# Patient Record
Sex: Female | Born: 1945
Health system: Southern US, Community
[De-identification: ages and names within clinical notes are randomized; demographics above are authoritative.]

## PROBLEM LIST (undated history)

## (undated) DIAGNOSIS — D649 Anemia, unspecified: Secondary | ICD-10-CM

## (undated) DIAGNOSIS — N814 Uterovaginal prolapse, unspecified: Secondary | ICD-10-CM

## (undated) DIAGNOSIS — K589 Irritable bowel syndrome without diarrhea: Secondary | ICD-10-CM

## (undated) DIAGNOSIS — I10 Essential (primary) hypertension: Secondary | ICD-10-CM

## (undated) DIAGNOSIS — R7303 Prediabetes: Secondary | ICD-10-CM

## (undated) DIAGNOSIS — G500 Trigeminal neuralgia: Secondary | ICD-10-CM

## (undated) DIAGNOSIS — M1990 Unspecified osteoarthritis, unspecified site: Secondary | ICD-10-CM

## (undated) DIAGNOSIS — N1832 Chronic kidney disease, stage 3b: Secondary | ICD-10-CM

## (undated) DIAGNOSIS — K219 Gastro-esophageal reflux disease without esophagitis: Secondary | ICD-10-CM

## (undated) DIAGNOSIS — J189 Pneumonia, unspecified organism: Secondary | ICD-10-CM

## (undated) DIAGNOSIS — E785 Hyperlipidemia, unspecified: Secondary | ICD-10-CM

## (undated) DIAGNOSIS — F419 Anxiety disorder, unspecified: Secondary | ICD-10-CM

## (undated) HISTORY — DX: Irritable bowel syndrome, unspecified: K58.9

## (undated) HISTORY — PX: CHOLECYSTECTOMY, LAPAROSCOPIC: SHX56

## (undated) HISTORY — PX: TUBAL LIGATION: SHX77

## (undated) HISTORY — DX: Hyperlipidemia, unspecified: E78.5

## (undated) HISTORY — DX: Essential (primary) hypertension: I10

## (undated) HISTORY — DX: Chronic kidney disease, stage 3b: N18.32

## (undated) HISTORY — DX: Trigeminal neuralgia: G50.0

## (undated) HISTORY — DX: Uterovaginal prolapse, unspecified: N81.4

---

## 2009-05-20 ENCOUNTER — Encounter: Payer: Self-pay | Admitting: Sports Medicine

## 2009-06-15 ENCOUNTER — Ambulatory Visit: Payer: Self-pay | Admitting: Sports Medicine

## 2009-06-15 DIAGNOSIS — M79609 Pain in unspecified limb: Secondary | ICD-10-CM

## 2009-06-15 DIAGNOSIS — M722 Plantar fascial fibromatosis: Secondary | ICD-10-CM | POA: Insufficient documentation

## 2009-08-03 ENCOUNTER — Ambulatory Visit: Payer: Self-pay | Admitting: Sports Medicine

## 2010-08-02 NOTE — Assessment & Plan Note (Signed)
Summary: FU FOOT PAIN/MJD   Vital Signs:  Patient profile:   65 year old female BP sitting:   128 / 79  Vitals Entered By: Lillia Pauls CMA (August 03, 2009 1:37 PM)  History of Present Illness: 65 yo F here for f/u L plantar fasciitis.  Overall improving Compliant with stretches, icing at nighttime, wearing comforthotics, arch straps. H/o issues L plantar fascia > 2 years. Had 2 shots in plantar fascia By u/s last visit saw a partial tear of planar fascia in mid-substance Seemed to have compensatory anterior pain about ankle joint, medial to ant tibialis. No issues with right foot.  Medications Prior to Update: 1)  Hydrogesic 5-500 Mg Caps (Hydrocodone-Acetaminophen) .Marland Kitchen.. 1 By Mouth Two Times A Day To Qid Foot Pain 2)  Lisinopril 40 Mg Tabs (Lisinopril) .Marland Kitchen.. 1 By Mouth Qd 3)  Doxazosin Mesylate 8 Mg Tabs (Doxazosin Mesylate) .Marland Kitchen.. 1 By Mouth Qd 4)  Pepcid Ac 10 Mg Tabs (Famotidine) .Marland Kitchen.. 1 By Mouth Qhs  Social History: Reviewed history from 06/15/2009 and no changes required. 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  No other abnormalities. TTP medial anterior portion of calcaneus. Mild TTP midportion of plantar fascia. No TTP metatarsals.  No bony TTP about foot/ankle otherwise. Strength 5/5 all motions without reproducing pain. Pes planus more significant on L than R.  Adequate post tib function bilaterally.  RT foot is non tender   Impression & Recommendations:  Problem # 1:  PLANTAR FASCIITIS, TRAUMATIC (ICD-728.71) Assessment Improved Continue with icing, eccentric exercises, arch strap, sports insoles.  Added additional arch support (scaphoid pads) to comforthotics.  F/u in 6 weeks for recheck.  Finances an issue with regard to custom  orthotics but consider these in future if does not continue to improve.  Believe anterior ankle pain is related to walking differently with pain/gait issues.  Problem # 2:  FOOT PAIN, LEFT (ICD-729.5)  Complete Medication List: 1)  Hydrogesic 5-500 Mg Caps (Hydrocodone-acetaminophen) .Marland Kitchen.. 1 by mouth two times a day to qid foot pain 2)  Lisinopril 40 Mg Tabs (Lisinopril) .Marland Kitchen.. 1 by mouth qd 3)  Doxazosin Mesylate 8 Mg Tabs (Doxazosin mesylate) .Marland Kitchen.. 1 by mouth qd 4)  Pepcid Ac 10 Mg Tabs (Famotidine) .Marland Kitchen.. 1 by mouth qhs 5)  Tramadol Hcl 50 Mg Tabs (Tramadol hcl) .Marland Kitchen.. 1 tab by mouth three times a day as needed pain  Patient Instructions: 1)  Continue doing the eccentric exercises daily - stretch down on a step 3 sets of 15 with knee bent and 3 sets of 15 with knee straight every day.  Use a book if it's too difficult on a step. 2)  Use the arch strap for pain relief. 3)  I have added additional arch support to your green insoles. 4)  Ice at nighttime for 10 minutes in a bucket of ice water. 5)  Follow up with [place] in 6 weeks for reevaluation. 6)  Custom orthotics are a consideration if you are doing the above and not improving. Prescriptions: TRAMADOL HCL 50 MG TABS (TRAMADOL HCL) 1 tab by mouth three times a day as needed pain  #90 x 1   Entered and Authorized by:   Norton Blizzard MD   Signed by:   Norton Blizzard MD on 08/03/2009   Method used:  Electronically to        [facility]       [address]       [place], [place]  [address]       Ph: [phone] or [phone]       Fax: [phone]   RxID:   [id number]

## 2015-12-06 DIAGNOSIS — E669 Obesity, unspecified: Secondary | ICD-10-CM | POA: Diagnosis not present

## 2015-12-06 DIAGNOSIS — Z139 Encounter for screening, unspecified: Secondary | ICD-10-CM | POA: Diagnosis not present

## 2015-12-06 DIAGNOSIS — M722 Plantar fascial fibromatosis: Secondary | ICD-10-CM | POA: Diagnosis not present

## 2015-12-06 DIAGNOSIS — M25572 Pain in left ankle and joints of left foot: Secondary | ICD-10-CM | POA: Diagnosis not present

## 2015-12-06 DIAGNOSIS — Z9181 History of falling: Secondary | ICD-10-CM | POA: Diagnosis not present

## 2015-12-06 DIAGNOSIS — E782 Mixed hyperlipidemia: Secondary | ICD-10-CM | POA: Diagnosis not present

## 2015-12-06 DIAGNOSIS — G500 Trigeminal neuralgia: Secondary | ICD-10-CM | POA: Diagnosis not present

## 2015-12-06 DIAGNOSIS — I10 Essential (primary) hypertension: Secondary | ICD-10-CM | POA: Diagnosis not present

## 2015-12-06 DIAGNOSIS — G4700 Insomnia, unspecified: Secondary | ICD-10-CM | POA: Diagnosis not present

## 2015-12-06 DIAGNOSIS — Z79899 Other long term (current) drug therapy: Secondary | ICD-10-CM | POA: Diagnosis not present

## 2015-12-06 DIAGNOSIS — R7303 Prediabetes: Secondary | ICD-10-CM | POA: Diagnosis not present

## 2015-12-06 DIAGNOSIS — L600 Ingrowing nail: Secondary | ICD-10-CM | POA: Diagnosis not present

## 2016-03-13 DIAGNOSIS — Z79899 Other long term (current) drug therapy: Secondary | ICD-10-CM | POA: Diagnosis not present

## 2016-03-13 DIAGNOSIS — N951 Menopausal and female climacteric states: Secondary | ICD-10-CM | POA: Diagnosis not present

## 2016-03-13 DIAGNOSIS — N8111 Cystocele, midline: Secondary | ICD-10-CM | POA: Diagnosis not present

## 2016-03-13 DIAGNOSIS — N841 Polyp of cervix uteri: Secondary | ICD-10-CM | POA: Diagnosis not present

## 2016-03-13 DIAGNOSIS — Z6835 Body mass index (BMI) 35.0-35.9, adult: Secondary | ICD-10-CM | POA: Diagnosis not present

## 2016-03-13 DIAGNOSIS — M722 Plantar fascial fibromatosis: Secondary | ICD-10-CM | POA: Diagnosis not present

## 2016-06-05 DIAGNOSIS — Z5329 Procedure and treatment not carried out because of patient's decision for other reasons: Secondary | ICD-10-CM | POA: Diagnosis not present

## 2016-06-05 DIAGNOSIS — Z1389 Encounter for screening for other disorder: Secondary | ICD-10-CM | POA: Diagnosis not present

## 2016-06-05 DIAGNOSIS — I10 Essential (primary) hypertension: Secondary | ICD-10-CM | POA: Diagnosis not present

## 2016-06-05 DIAGNOSIS — G4700 Insomnia, unspecified: Secondary | ICD-10-CM | POA: Diagnosis not present

## 2016-06-05 DIAGNOSIS — Z2821 Immunization not carried out because of patient refusal: Secondary | ICD-10-CM | POA: Diagnosis not present

## 2016-06-05 DIAGNOSIS — Z6834 Body mass index (BMI) 34.0-34.9, adult: Secondary | ICD-10-CM | POA: Diagnosis not present

## 2016-06-05 DIAGNOSIS — G500 Trigeminal neuralgia: Secondary | ICD-10-CM | POA: Diagnosis not present

## 2016-06-05 DIAGNOSIS — M722 Plantar fascial fibromatosis: Secondary | ICD-10-CM | POA: Diagnosis not present

## 2016-06-05 DIAGNOSIS — E782 Mixed hyperlipidemia: Secondary | ICD-10-CM | POA: Diagnosis not present

## 2016-06-05 DIAGNOSIS — E669 Obesity, unspecified: Secondary | ICD-10-CM | POA: Diagnosis not present

## 2016-06-05 DIAGNOSIS — R7303 Prediabetes: Secondary | ICD-10-CM | POA: Diagnosis not present

## 2016-07-02 DIAGNOSIS — J0100 Acute maxillary sinusitis, unspecified: Secondary | ICD-10-CM | POA: Diagnosis not present

## 2016-07-02 DIAGNOSIS — J309 Allergic rhinitis, unspecified: Secondary | ICD-10-CM | POA: Diagnosis not present

## 2016-08-01 DIAGNOSIS — E669 Obesity, unspecified: Secondary | ICD-10-CM | POA: Diagnosis not present

## 2016-08-01 DIAGNOSIS — I10 Essential (primary) hypertension: Secondary | ICD-10-CM | POA: Insufficient documentation

## 2016-08-01 DIAGNOSIS — G500 Trigeminal neuralgia: Secondary | ICD-10-CM | POA: Diagnosis not present

## 2016-08-01 DIAGNOSIS — Z6838 Body mass index (BMI) 38.0-38.9, adult: Secondary | ICD-10-CM | POA: Insufficient documentation

## 2016-09-04 DIAGNOSIS — G4700 Insomnia, unspecified: Secondary | ICD-10-CM | POA: Diagnosis not present

## 2016-09-04 DIAGNOSIS — Z79891 Long term (current) use of opiate analgesic: Secondary | ICD-10-CM | POA: Diagnosis not present

## 2016-09-04 DIAGNOSIS — E782 Mixed hyperlipidemia: Secondary | ICD-10-CM | POA: Diagnosis not present

## 2016-09-04 DIAGNOSIS — M722 Plantar fascial fibromatosis: Secondary | ICD-10-CM | POA: Diagnosis not present

## 2016-09-04 DIAGNOSIS — R7303 Prediabetes: Secondary | ICD-10-CM | POA: Diagnosis not present

## 2016-09-04 DIAGNOSIS — Z5329 Procedure and treatment not carried out because of patient's decision for other reasons: Secondary | ICD-10-CM | POA: Diagnosis not present

## 2016-09-04 DIAGNOSIS — Z6838 Body mass index (BMI) 38.0-38.9, adult: Secondary | ICD-10-CM | POA: Diagnosis not present

## 2016-11-29 ENCOUNTER — Ambulatory Visit (INDEPENDENT_AMBULATORY_CARE_PROVIDER_SITE_OTHER): Payer: PPO | Admitting: Podiatry

## 2016-11-29 ENCOUNTER — Encounter: Payer: Self-pay | Admitting: Podiatry

## 2016-11-29 ENCOUNTER — Ambulatory Visit (INDEPENDENT_AMBULATORY_CARE_PROVIDER_SITE_OTHER): Payer: PPO

## 2016-11-29 DIAGNOSIS — M129 Arthropathy, unspecified: Secondary | ICD-10-CM

## 2016-11-29 DIAGNOSIS — R52 Pain, unspecified: Secondary | ICD-10-CM

## 2016-11-29 DIAGNOSIS — M722 Plantar fascial fibromatosis: Secondary | ICD-10-CM | POA: Diagnosis not present

## 2016-11-29 MED ORDER — MELOXICAM 15 MG PO TABS: 15.0000 mg | ORAL_TABLET | Freq: Every day | ORAL | 0 refills | Status: DC

## 2016-11-29 NOTE — Progress Notes (Addendum)
   Subjective:    Patient ID: Abigail Glover, female    DOB: [birth year], 71 y.o.   MRN: [id number]  HPI this patient presents the office with chief complaint of 2 painful feet she says her left foot is painful on the top of her foot she says it is painful after activity she says she has pain noted in the right heel which is present upon rising and standing from a sitting position she says her pain level is approximately 6-10 she says she has provided no self treatment or sought  any professional help.  She presents the office today for evaluation and treatment of these conditions.    Review of Systems  Musculoskeletal: Positive for gait problem.  All other systems reviewed and are negative.      Objective:   Physical Exam GENERAL APPEARANCE: Alert, conversant. Appropriately groomed. No acute distress.  VASCULAR: Pedal pulses are  palpable at  [facility] and PT bilateral.  Capillary refill time is immediate to all digits,  Normal temperature gradient.   NEUROLOGIC: sensation is normal to 5.07 monofilament at 5/5 sites bilateral.  Light touch is intact bilateral, Muscle strength normal.  MUSCULOSKELETAL: acceptable muscle strength, tone and stability bilateral.  Intrinsic muscluature intact bilateral.  Rectus appearance of foot and digits noted bilateral.  Palpable pain at the insertion plantar fascia right foot.  Pain to Liz-Frank Joint left foot.  DERMATOLOGIC: skin color, texture, and turgor are within normal limits.  No preulcerative lesions or ulcers  are seen, no interdigital maceration noted.  No open lesions present.  Digital nails are asymptomatic. No drainage noted.         Assessment & Plan:  Plantar fascitis  B/L  Arthritis left foot.    IE  X-rays were taken which revealed calcification at the insertion of the plantar fascia both feet and I recommended she wear power step insoles in an effort to relieve the pole of the plantar fascia on the right foot and relieved stretch to the  Lisfranc's joint of the left foot we also called in Mobic  for her to take one a day. she is returns the office in 3 weeks which time we will reevaluate her condition.  Injection therapy right heel.   Gardiner Barefoot DPM

## 2016-12-05 DIAGNOSIS — M722 Plantar fascial fibromatosis: Secondary | ICD-10-CM | POA: Diagnosis not present

## 2016-12-05 DIAGNOSIS — Z6839 Body mass index (BMI) 39.0-39.9, adult: Secondary | ICD-10-CM | POA: Diagnosis not present

## 2016-12-05 DIAGNOSIS — I10 Essential (primary) hypertension: Secondary | ICD-10-CM | POA: Diagnosis not present

## 2016-12-05 DIAGNOSIS — Z5181 Encounter for therapeutic drug level monitoring: Secondary | ICD-10-CM | POA: Diagnosis not present

## 2016-12-05 DIAGNOSIS — E782 Mixed hyperlipidemia: Secondary | ICD-10-CM | POA: Diagnosis not present

## 2016-12-05 DIAGNOSIS — G4700 Insomnia, unspecified: Secondary | ICD-10-CM | POA: Diagnosis not present

## 2016-12-05 DIAGNOSIS — R7303 Prediabetes: Secondary | ICD-10-CM | POA: Diagnosis not present

## 2016-12-20 ENCOUNTER — Ambulatory Visit (INDEPENDENT_AMBULATORY_CARE_PROVIDER_SITE_OTHER): Payer: PPO | Admitting: Podiatry

## 2016-12-20 ENCOUNTER — Encounter: Payer: Self-pay | Admitting: Podiatry

## 2016-12-20 VITALS — BP 153/85 | HR 82

## 2016-12-20 DIAGNOSIS — M722 Plantar fascial fibromatosis: Secondary | ICD-10-CM | POA: Diagnosis not present

## 2016-12-20 MED ORDER — DICLOFENAC SODIUM 75 MG PO TBEC: 75.0000 mg | DELAYED_RELEASE_TABLET | Freq: Two times a day (BID) | ORAL | 1 refills | Status: DC

## 2016-12-20 NOTE — Progress Notes (Signed)
   Subjective:    Patient ID: Abigail Glover, female    DOB: August 13, 1945, 71 y.o.   MRN: [id number]  HPI this patient presents the office For continued evaluation of her plantar fasciitis. She was treated with injection therapy in her right heel at her last visit and given a prescription of Mobic and power step insoles. She says her right heel is approximately 50% better and that the pain continues. She also says pain continues in her left heel. She says she does not feel the medication by mouth has been effective. She does say that she was very active this past weekend shopping which she says has led to the pain returning. She presents the office today for an evaluation and treatment of her plantar fasciitis.  Patient does not mention her arthritis pain at this visit.    Review of Systems  Musculoskeletal: Positive for gait problem.  All other systems reviewed and are negative.      Objective:   Physical Exam GENERAL APPEARANCE: Alert, conversant. Appropriately groomed. No acute distress.  VASCULAR: Pedal pulses are  palpable at  [facility] and PT bilateral.  Capillary refill time is immediate to all digits,  Normal temperature gradient.   NEUROLOGIC: sensation is normal to 5.07 monofilament at 5/5 sites bilateral.  Light touch is intact bilateral, Muscle strength normal.  MUSCULOSKELETAL: acceptable muscle strength, tone and stability bilateral.  Intrinsic muscluature intact bilateral.  Rectus appearance of foot and digits noted bilateral.  Palpable pain at the insertion plantar fascia right foot and left foot.  DERMATOLOGIC: skin color, texture, and turgor are within normal limits.  No preulcerative lesions or ulcers  are seen, no interdigital maceration noted.  No open lesions present.  Digital nails are asymptomatic. No drainage noted.         Assessment & Plan:  Plantar fascitis  B/L  Arthritis left foot.    ROV  . After discussing the previous treatment with this patient. We decided to  give her more time to allow the foot to heal itself.  She will be prescribed Voltaren  75 mg instead of the Mobic and to continue to wear the power step insoles. She is to return the office in 3 weeks at which time we will reevaluate her condition  and treat her.     Gardiner Barefoot DPM   Gardiner Barefoot DPM

## 2016-12-25 ENCOUNTER — Encounter: Payer: Self-pay | Admitting: Neurology

## 2016-12-26 ENCOUNTER — Other Ambulatory Visit: Payer: Self-pay | Admitting: *Deleted

## 2016-12-26 MED ORDER — DICLOFENAC SODIUM 75 MG PO TBEC: 75.0000 mg | DELAYED_RELEASE_TABLET | Freq: Two times a day (BID) | ORAL | 1 refills | Status: DC

## 2017-01-16 DIAGNOSIS — J0100 Acute maxillary sinusitis, unspecified: Secondary | ICD-10-CM | POA: Diagnosis not present

## 2017-01-23 ENCOUNTER — Ambulatory Visit (INDEPENDENT_AMBULATORY_CARE_PROVIDER_SITE_OTHER): Payer: PPO | Admitting: Podiatry

## 2017-01-23 DIAGNOSIS — M722 Plantar fascial fibromatosis: Secondary | ICD-10-CM

## 2017-01-23 MED ORDER — MELOXICAM 15 MG PO TABS: 15.0000 mg | ORAL_TABLET | Freq: Every day | ORAL | 0 refills | Status: DC

## 2017-01-23 NOTE — Progress Notes (Signed)
   Subjective:    Patient ID: Abigail Glover, female    DOB: Mar 07, 1946, 71 y.o.   MRN: [id number]  Foot Pain    This patient presents the office for continued evaluation of plantar fasciitis right heel.  She was given a prescription for Voltaren and told to continue to wear her power step insoles.  She says there has been minimal improvement and that she returns the office prepared for the injection into the right heel.  She presents the office today for an evaluation and treatment of her plantar fascial problem    Review of Systems  Musculoskeletal: Positive for gait problem.  All other systems reviewed and are negative.      Objective:   Physical Exam GENERAL APPEARANCE: Alert, conversant. Appropriately groomed. No acute distress.  VASCULAR: Pedal pulses are  palpable at  [facility] and PT bilateral.  Capillary refill time is immediate to all digits,  Normal temperature gradient.   NEUROLOGIC: sensation is normal to 5.07 monofilament at 5/5 sites bilateral.  Light touch is intact bilateral, Muscle strength normal.  MUSCULOSKELETAL: acceptable muscle strength, tone and stability bilateral.  Intrinsic muscluature intact bilateral.  Rectus appearance of foot and digits noted bilateral.  Palpable pain at the insertion plantar fascia right foot and left foot.  DERMATOLOGIC: skin color, texture, and turgor are within normal limits.  No preulcerative lesions or ulcers  are seen, no interdigital maceration noted.  No open lesions present.  Digital nails are asymptomatic. No drainage noted.         Assessment & Plan:  Plantar fascitis  B/L  Arthritis left foot.    ROV   Injection therapy right heel.  Patient told to continue using her power step insoles.  Prescribe Mobic by mouth.  Return to the clinic in 3 weeks   Gardiner Barefoot DPM   Gardiner Barefoot DPM

## 2017-02-15 ENCOUNTER — Ambulatory Visit (INDEPENDENT_AMBULATORY_CARE_PROVIDER_SITE_OTHER): Payer: PPO | Admitting: Podiatry

## 2017-02-15 ENCOUNTER — Encounter: Payer: Self-pay | Admitting: Podiatry

## 2017-02-15 DIAGNOSIS — M722 Plantar fascial fibromatosis: Secondary | ICD-10-CM

## 2017-02-15 NOTE — Progress Notes (Signed)
This patient returns to the office follow-up for plantar fasciitis bilaterally with pain more severe in her right heel.  She says that her pain has become less but she is still having pain and discomfort when she walks.  She says she still experiencing throbbing even in a sitting position.  She is been treated previously with power step insoles injection therapy and anti-inflammatory medication.  She says her pain is still at a level V.  She still says she has swelling noted in her right heel compared to her left heel.  She presents the office today for continued evaluation and treatment of her painful right heel   GENERAL APPEARANCE: Alert, conversant. Appropriately groomed. No acute distress.  VASCULAR: Pedal pulses are  palpable at  [facility] and PT bilateral.  Capillary refill time is immediate to all digits,  Normal temperature gradient.  Digital hair growth is present bilateral  NEUROLOGIC: sensation is normal to 5.07 monofilament at 5/5 sites bilateral.  Light touch is intact bilateral, Muscle strength normal.  MUSCULOSKELETAL: acceptable muscle strength, tone and stability bilateral.  Intrinsic muscluature intact bilateral.  Rectus appearance of foot and digits noted bilateral. Palpable pain at the insertion right plantar fascia with marked swelling noted.  DERMATOLOGIC: skin color, texture, and turgor are within normal limits.  No preulcerative lesions or ulcers  are seen, no interdigital maceration noted.  No open lesions present.  Digital nails are asymptomatic. No drainage noted.  Plantar fascitis right heel.  ROV  patient has continued pain noted in her right heel.  Dispensed a plantar fascial brace to be worn on her foot and she was told to remove the power step insoles.  She was told to continue taking her Mobic.  She is to wear  the plantar fascial brace for 2 weeks and take the medication.  RTC 2 weeks.  This patient will be evaluated in 2 weeks and if the pain persists we will try a night  brace  if there is no improvement, we will then consider surgical correction.   Gardiner Barefoot DPM.

## 2017-03-01 ENCOUNTER — Telehealth: Payer: Self-pay | Admitting: Podiatry

## 2017-03-01 MED ORDER — MELOXICAM 15 MG PO TABS: 15.0000 mg | ORAL_TABLET | Freq: Every day | ORAL | 0 refills | Status: DC

## 2017-03-01 NOTE — Telephone Encounter (Signed)
Pt needs refill of Meloxicam. Pharmacy, [facility].

## 2017-03-01 NOTE — Addendum Note (Signed)
Addended by: Harriett Sine D on: 03/01/2017 04:54 PM   Modules accepted: Orders

## 2017-03-07 ENCOUNTER — Ambulatory Visit (INDEPENDENT_AMBULATORY_CARE_PROVIDER_SITE_OTHER): Payer: PPO | Admitting: Podiatry

## 2017-03-07 ENCOUNTER — Encounter: Payer: Self-pay | Admitting: Podiatry

## 2017-03-07 DIAGNOSIS — M722 Plantar fascial fibromatosis: Secondary | ICD-10-CM

## 2017-03-07 NOTE — Progress Notes (Signed)
This patient returns to the office follow-up for plantar fasciitis bilaterally with pain more severe in her right heel.  She says that her pain has become less but she is still having pain and discomfort when she walks.  She has previously been seen in the office and treated with injection therapy power step insoles and low back.  Her last visit she was treated with a plantar fascial brace.  She says that her pain now is 3 out of 10.  She says the brace seems to be helping as well as a change of foot gear   She presents the office today for continued evaluation and treatment of her painful right heel   GENERAL APPEARANCE: Alert, conversant. Appropriately groomed. No acute distress.  VASCULAR: Pedal pulses are  palpable at  [facility] and PT bilateral.  Capillary refill time is immediate to all digits,  Normal temperature gradient.  Digital hair growth is present bilateral  NEUROLOGIC: sensation is normal to 5.07 monofilament at 5/5 sites bilateral.  Light touch is intact bilateral, Muscle strength normal.  MUSCULOSKELETAL: acceptable muscle strength, tone and stability bilateral.  Intrinsic muscluature intact bilateral.  Rectus appearance of foot and digits noted bilateral. Palpable pain at the insertion right plantar fascia with marked swelling noted.  DERMATOLOGIC: skin color, texture, and turgor are within normal limits.  No preulcerative lesions or ulcers  are seen, no interdigital maceration noted.  No open lesions present.  Digital nails are asymptomatic. No drainage noted.  Plantar fascitis right heel.  ROV  patient was told to continue taking Mobic by mouth.  She is to continue wearing her approved footgear.  Continue to use the plantar fascial brace.  I will prescribe her a night brace to be worn since her pain continues and has not completely resolved.  She is to return the office in 4 weeks for further evaluation and treatment   Gardiner Barefoot DPM.

## 2017-03-13 ENCOUNTER — Telehealth: Payer: Self-pay | Admitting: *Deleted

## 2017-03-13 MED ORDER — MELOXICAM 15 MG PO TABS: 15.0000 mg | ORAL_TABLET | Freq: Every day | ORAL | 0 refills | Status: DC

## 2017-03-13 NOTE — Telephone Encounter (Signed)
Pt states she saw Dr. Prudence Davidson on 03/07/2017 and would like to get refill of Meloxicam. Dr. Prudence Davidson ordered refill Meloxicam 15mg  #30 one daily and pt needs an appt prior to future refills.

## 2017-03-14 DIAGNOSIS — I10 Essential (primary) hypertension: Secondary | ICD-10-CM | POA: Diagnosis not present

## 2017-03-14 DIAGNOSIS — M722 Plantar fascial fibromatosis: Secondary | ICD-10-CM | POA: Diagnosis not present

## 2017-03-14 DIAGNOSIS — E782 Mixed hyperlipidemia: Secondary | ICD-10-CM | POA: Diagnosis not present

## 2017-03-14 DIAGNOSIS — E669 Obesity, unspecified: Secondary | ICD-10-CM | POA: Diagnosis not present

## 2017-03-14 DIAGNOSIS — Z6839 Body mass index (BMI) 39.0-39.9, adult: Secondary | ICD-10-CM | POA: Diagnosis not present

## 2017-03-14 DIAGNOSIS — Z9181 History of falling: Secondary | ICD-10-CM | POA: Diagnosis not present

## 2017-03-14 DIAGNOSIS — Z79899 Other long term (current) drug therapy: Secondary | ICD-10-CM | POA: Diagnosis not present

## 2017-03-14 DIAGNOSIS — G4700 Insomnia, unspecified: Secondary | ICD-10-CM | POA: Diagnosis not present

## 2017-03-30 ENCOUNTER — Encounter: Payer: Self-pay | Admitting: Neurology

## 2017-03-30 ENCOUNTER — Ambulatory Visit (INDEPENDENT_AMBULATORY_CARE_PROVIDER_SITE_OTHER): Payer: PPO | Admitting: Neurology

## 2017-03-30 VITALS — BP 120/78 | HR 105 | Wt 206.0 lb

## 2017-03-30 DIAGNOSIS — M79605 Pain in left leg: Secondary | ICD-10-CM

## 2017-03-30 DIAGNOSIS — G500 Trigeminal neuralgia: Secondary | ICD-10-CM

## 2017-03-30 MED ORDER — TIZANIDINE HCL 2 MG PO CAPS: 2.0000 mg | ORAL_CAPSULE | Freq: Every evening | ORAL | 0 refills | Status: DC | PRN

## 2017-03-30 NOTE — Progress Notes (Signed)
[facility] Clinic Note - Initial Visit   Date: 03/30/17  Abigail Glover MRN: [id number] DOB: 1945-11-04   Dear Dr. Tonia Ghent:  Thank you for your kind referral of Abigail Glover for consultation of left trigeminal neuralgia. Although her history is well known to you, please allow [place] to reiterate it for the purpose of our medical record. The patient was accompanied to the clinic by self.    History of Present Illness: Abigail Glover is a 71 y.o. left-handed Caucasian female with hypertension presenting for evaluation of left trigeminal neuralgia.    She was diagnosed with left trigeminal neuralgia in 2014 by Dr. Metta Clines, which was well-controlled on gabapentin so she had stopped the medication.  Symptoms always started in the left ear and would radiate into the jaw region.  She never had pain involving the left eye, forehead, and only rare into the cheek.  Symptoms were aggravated by cold drink and cold air.  She does not have teeth on the left side, so does not chew on that side or brush her teeth.  Her symptoms were doing great off medication for several years, but in early January 2018, she began having another flare of pain and saw Dr. Tonia Ghent.  She was restarted on gabapentin 1200mg  TID and due to progressive pain, she was placed on carbamazepine 200mg  3-4 times daily which helped control symptoms.  She continues to have mild prickly sharp pain over the left jaw, which constant. MRI brain has not been done.  She has no history of diabetes.  She also complains of left posterior thigh pain which started several weeks ago.  Pain is sharp and non-radiating below the left buttocks.  There is no associated weakness or numbness/tingling.  Pain is present at rest and feels achy, and worse with standing.      Past Medical History:  Diagnosis Date  . Hypertension   . Trigeminal neuralgia of left side of face     History reviewed. No pertinent surgical  history.   Medications:  Outpatient Encounter Prescriptions as of 03/30/2017  Medication Sig  . aspirin EC 81 MG tablet Take 81 mg by mouth daily.  . calcium carbonate (TUMS - DOSED IN MG ELEMENTAL CALCIUM) 500 MG chewable tablet Chew 1 tablet by mouth daily.  . carbamazepine (TEGRETOL) 200 MG tablet   . gabapentin (NEURONTIN) 600 MG tablet   . hydrALAZINE (APRESOLINE) 25 MG tablet   . HYDROcodone-acetaminophen (NORCO/VICODIN) 5-325 MG tablet   . losartan (COZAAR) 100 MG tablet   . meloxicam (MOBIC) 15 MG tablet Take 15 mg by mouth daily.  Marland Kitchen omeprazole (PRILOSEC) 10 MG capsule Take 10 mg by mouth.  . terazosin (HYTRIN) 10 MG capsule Take 10 mg by mouth.  Marland Kitchen UNABLE TO FIND Take by mouth. calcium carbonate (TUMS) 200 mg calcium (500 mg) chewable tablet  . zolpidem (AMBIEN) 10 MG tablet Take 10 mg by mouth at bedtime as needed for sleep.  . tizanidine (ZANAFLEX) 2 MG capsule Take 1 capsule (2 mg total) by mouth at bedtime as needed for muscle spasms.  . [DISCONTINUED] diclofenac (VOLTAREN) 75 MG EC tablet Take 1 tablet (75 mg total) by mouth 2 (two) times daily.  . [DISCONTINUED] doxazosin (CARDURA) 8 MG tablet Take 8 mg by mouth daily.    . [DISCONTINUED] famotidine (PEPCID) 10 MG tablet Take 10 mg by mouth at bedtime.    . [DISCONTINUED] hydrocodone-acetaminophen (LORCET-HD) 5-500 MG per capsule Take 1 capsule by mouth. 2-4 times  per day for foot pain.   . [DISCONTINUED] lisinopril (PRINIVIL,ZESTRIL) 40 MG tablet Take 40 mg by mouth daily.    . [DISCONTINUED] meloxicam (MOBIC) 15 MG tablet Take 1 tablet (15 mg total) by mouth daily.  . [DISCONTINUED] traMADol (ULTRAM) 50 MG tablet Take 50 mg by mouth 3 (three) times daily as needed.     No facility-administered encounter medications on file as of 03/30/2017.      Allergies:  Allergies  Allergen Reactions  . Amitriptyline   . Cephalexin   . Penicillin G   . Prednisone   . Seasonal Ic [Cholestatin]     Family History:  No family  history of neurological disease.   Social History: Social History  Substance Use Topics  . Smoking status: Former Research scientist (life sciences)  . Smokeless tobacco: Never Used  . Alcohol use No   Social History   Social History Narrative   Lives alone in a one story home.  Has 1 dog and 1 daughter.  Works as Scientist, water quality at [employer].  Education: high school.    Review of Systems:  CONSTITUTIONAL: No fevers, chills, night sweats, or weight loss.   EYES: No visual changes or eye pain ENT: No hearing changes.  No history of nose bleeds.   RESPIRATORY: No cough, wheezing and shortness of breath.   CARDIOVASCULAR: Negative for chest pain, and palpitations.   GI: Negative for abdominal discomfort, blood in stools or black stools.  No recent change in bowel habits.   GU:  No history of incontinence.   MUSCLOSKELETAL: No history of joint pain or swelling.  No myalgias.   SKIN: Negative for lesions, rash, and itching.   HEMATOLOGY/ONCOLOGY: Negative for prolonged bleeding, bruising easily, and swollen nodes.  No history of cancer.   ENDOCRINE: Negative for cold or heat intolerance, polydipsia or goiter.   PSYCH:  No depression or anxiety symptoms.   NEURO: As Above.   Vital Signs:  BP 120/78   Pulse (!) 105   Wt 206 lb (93.4 kg)   SpO2 97%   BMI 40.23 kg/m  Pain Scale: 0 on a scale of 0-10   General Medical Exam:   General:  Well appearing, comfortable.   Eyes/ENT: see cranial nerve examination.   Neck: No masses appreciated.  Full range of motion without tenderness.  No carotid bruits. Respiratory:  Clear to auscultation, good air entry bilaterally.   Cardiac:  Regular rate and rhythm, no murmur.   Extremities:  No deformities, edema, or skin discoloration.  Skin:  No rashes or lesions.  Neurological Exam: MENTAL STATUS including orientation to time, place, person, recent and remote memory, attention span and concentration, language, and fund of knowledge is normal.  Speech is not  dysarthric.  CRANIAL NERVES: II:  No visual field defects.  Unremarkable fundi.   III-IV-VI: Pupils equal round and reactive to light.  Normal conjugate, extra-ocular eye movements in all directions of gaze.  No nystagmus.  Mild left ptosis.   V:  Normal facial sensation.  Marland Kitchen   VII:  Normal facial symmetry and movements.   VIII:  Normal hearing and vestibular function.   IX-X:  Normal palatal movement.   XI:  Normal shoulder shrug and head rotation.   XII:  Normal tongue strength and range of motion, no deviation or fasciculation.  MOTOR:  No atrophy, fasciculations or abnormal movements.  No pronator drift.  Tone is normal.    Right Upper Extremity:    Left Upper Extremity:  Deltoid  5/5   Deltoid  5/5   Biceps  5/5   Biceps  5/5   Triceps  5/5   Triceps  5/5   Wrist extensors  5/5   Wrist extensors  5/5   Wrist flexors  5/5   Wrist flexors  5/5   Finger extensors  5/5   Finger extensors  5/5   Finger flexors  5/5   Finger flexors  5/5   Dorsal interossei  5/5   Dorsal interossei  5/5   Abductor pollicis  5/5   Abductor pollicis  5/5   Tone (Ashworth scale)  0  Tone (Ashworth scale)  0   Right Lower Extremity:    Left Lower Extremity:    Hip flexors  5/5   Hip flexors  5/5   Hip extensors  5/5   Hip extensors  5/5   Knee flexors  5/5   Knee flexors  5/5   Knee extensors  5/5   Knee extensors  5/5   Dorsiflexors  5/5   Dorsiflexors  5/5   Plantarflexors  5/5   Plantarflexors  5/5   Toe extensors  5/5   Toe extensors  5/5   Toe flexors  5/5   Toe flexors  5/5   Tone (Ashworth scale)  0  Tone (Ashworth scale)  0   MSRs:  Right                                                                 Left brachioradialis 2+  brachioradialis 2+  biceps 2+  biceps 2+  triceps 2+  triceps 2+  patellar 2+  patellar 2+  ankle jerk 2+  ankle jerk 2+  Hoffman no  Hoffman no  plantar response down  plantar response down   SENSORY:  Normal and symmetric perception of light touch, pinprick,  vibration, and proprioception.  Romberg's sign absent.   COORDINATION/GAIT: Normal finger-to- nose-finger and heel-to-shin.  Intact rapid alternating movements bilaterally.  Gait is antalgic due to left leg pain.   IMPRESSION: 1.  Left trigeminal neuralgia, diagnosed in 2014.  Symptoms initially controlled on gabapentin but earlier in 2018, she started having severe pain therefore carbmazapine 200mg  3-4 times daily was started, which has helped her pain and she is tolerating.  Currently, she denies severe lancinating pain and would like to reduce the amount of medication that she is taking.  I have given her a slow taper schedule of reducing gabapentin by 300mg /week.  Stay on carbamazepine 200mg  3-4 times daily.  MRI brain declined at this time due to financial constraints, but she will reconsider imaging if symptoms get worse.  I have asked her to call the office with an update in 2 months notifying us of the dose of gabapentin and if pain is still controlled.  If so, may consider tapering further by 300mg /week.  2.  Left leg pain, likely musculoskeletal.  No features to suggest this is radicular pain.  Recommend ibuprofen and offered tizanidine 2mg  at bedtime.  Follow-up with PCP, if no improvement  Return to clinic in 4 months   The duration of this appointment visit was 45 minutes of face-to-face time with the patient.  Greater than 50% of this time was spent in counseling, explanation of  diagnosis, planning of further management, and coordination of care.   Thank you for allowing me to participate in patient's care.  If I can answer any additional questions, I would be pleased to do so.    Sincerely,    Donika K. Posey Pronto, DO

## 2017-03-30 NOTE — Patient Instructions (Addendum)
1.  Reduce gabapentin as follows  Gabapentin 600 mg tablets    Morning       Afternoon        Evening  Week 1 1.5 tab  2 tab             2 tab              Week 2 1.5 tab            1.5 tab           2 tab              Week 3 1.5 tab          1.5 tab          1.5 tab         Week 4 1 tab  1.5  tab         1.5 tab              Week 5 1 tab                1 tab             1.5 tab              Continue  1 tab          1 tab             1 tab         Call with update in 2 months, to determine further increase in medication.  If you develop increased pain, go back to taking the higher dose that controlled symptoms.            2.  Continue carbamazepine 200mg  three times daily  3.  Start tizandine 2mg  at bedtime for your left leg pain.   You can also take ibuprofen 200mg  three times daily as needed for pain  Return to clinic in 4 months

## 2017-04-01 DIAGNOSIS — M25552 Pain in left hip: Secondary | ICD-10-CM | POA: Diagnosis not present

## 2017-04-01 DIAGNOSIS — S76012A Strain of muscle, fascia and tendon of left hip, initial encounter: Secondary | ICD-10-CM | POA: Diagnosis not present

## 2017-04-03 DIAGNOSIS — R21 Rash and other nonspecific skin eruption: Secondary | ICD-10-CM | POA: Diagnosis not present

## 2017-04-03 DIAGNOSIS — S76111A Strain of right quadriceps muscle, fascia and tendon, initial encounter: Secondary | ICD-10-CM | POA: Diagnosis not present

## 2017-04-04 ENCOUNTER — Ambulatory Visit: Payer: PPO | Admitting: Podiatry

## 2017-04-11 ENCOUNTER — Ambulatory Visit (INDEPENDENT_AMBULATORY_CARE_PROVIDER_SITE_OTHER): Payer: PPO | Admitting: Podiatry

## 2017-04-11 ENCOUNTER — Encounter: Payer: Self-pay | Admitting: Podiatry

## 2017-04-11 DIAGNOSIS — D3610 Benign neoplasm of peripheral nerves and autonomic nervous system, unspecified: Secondary | ICD-10-CM

## 2017-04-11 DIAGNOSIS — M7672 Peroneal tendinitis, left leg: Secondary | ICD-10-CM

## 2017-04-11 NOTE — Progress Notes (Signed)
This patient returns to the office follow-up for plantar fasciitis bilaterally with pain more severe in her right heel.  She says that the pain in the bottom of her feet from plantar fasciitis has resolved.  She says she has developed 2 areas that have become painful.  She says that the third toe of the right foot feels like it is falling off at times.  She denies any history of trauma or injury to the foot.  She also says she has developed on the outside of her left foot.  Minimal swelling noted.   GENERAL APPEARANCE: Alert, conversant. Appropriately groomed. No acute distress.  VASCULAR: Pedal pulses are  palpable at  [facility] and PT bilateral.  Capillary refill time is immediate to all digits,  Normal temperature gradient.  Digital hair growth is present bilateral  NEUROLOGIC: sensation is normal to 5.07 monofilament at 5/5 sites bilateral.  Light touch is intact bilateral, Muscle strength normal.  MUSCULOSKELETAL: acceptable muscle strength, tone and stability bilateral.  Intrinsic muscluature intact bilateral.  Rectus appearance of foot and digits noted bilateral.  No pain noted through the plantar fascia bilaterally.  Palpable pain noted in the third interspace of the right foot.  Pain noted at the insertion of the peroneal tendon at the base of the fifth metatarsal left foot.  DERMATOLOGIC: skin color, texture, and turgor are within normal limits.  No preulcerative lesions or ulcers  are seen, no interdigital maceration noted.  No open lesions present.  Digital nails are asymptomatic. No drainage noted.  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  Peroneal tendinitis left foot.    ROV  patient was diagnosed with a neuroma of the right foot.  My examination revealed significant hypermobility of the right forefoot.  This hypermobility has led to neuroma-like pain.  She also has pain at the insertion of the peroneal tendon of the left foot.  Told this patient to put away her plantar fascial brace and return to the  power step insoles  . This will help to limit the hypermobility and prevent significant pronation and thus relieve the insertional pain of the peroneal tendon left foot.  She was also told to continue the Mobic as needed RTC prn.Gardiner Barefoot DPM.

## 2017-04-24 ENCOUNTER — Telehealth: Payer: Self-pay | Admitting: *Deleted

## 2017-04-24 MED ORDER — MELOXICAM 15 MG PO TABS: 15.0000 mg | ORAL_TABLET | Freq: Every day | ORAL | 1 refills | Status: DC

## 2017-04-24 NOTE — Telephone Encounter (Signed)
Pt request refill of Meloxicam. Dr. Burnell Blanks LOV 04/11/2017 stated he wanted pt to continue the meloxicam as needed. I informed pt that Dr. Prudence Davidson had wanted her to continue the meloxicam as needed and ordered refill +1additional and if she continued to have problems to make an appt prior to future refills.

## 2017-04-30 ENCOUNTER — Telehealth: Payer: Self-pay | Admitting: Podiatry

## 2017-04-30 MED ORDER — MELOXICAM 15 MG PO TABS: 15.0000 mg | ORAL_TABLET | Freq: Every day | ORAL | 1 refills | Status: DC

## 2017-04-30 NOTE — Telephone Encounter (Signed)
Left message informing pt the Meloxicam had been sent to the [facility], but I had changed to the [facility], I apologized for the delay.

## 2017-04-30 NOTE — Telephone Encounter (Signed)
I have checked twice with my pharmacy and they said they still have not got a call in regards to the meloxicam refills. I know its on their end and not yours. I was calling to see if you could re-fax them or re-call in the refills. The pharmacy is [facility]. If you need to give me a call, please call me at home [phone]. Thank you for your help.

## 2017-04-30 NOTE — Addendum Note (Signed)
Addended by: Harriett Sine D on: 04/30/2017 03:48 PM   Modules accepted: Orders

## 2017-06-01 ENCOUNTER — Telehealth: Payer: Self-pay | Admitting: Neurology

## 2017-06-01 NOTE — Telephone Encounter (Signed)
Pt called with update on medication gabapentin, 1 tablet 3x a day, doing ok with the lower dosage,  And also the carbamazepine, doing ok on both

## 2017-06-01 NOTE — Telephone Encounter (Signed)
FYI

## 2017-06-14 DIAGNOSIS — Z2821 Immunization not carried out because of patient refusal: Secondary | ICD-10-CM | POA: Diagnosis not present

## 2017-06-14 DIAGNOSIS — E782 Mixed hyperlipidemia: Secondary | ICD-10-CM | POA: Diagnosis not present

## 2017-06-14 DIAGNOSIS — G4700 Insomnia, unspecified: Secondary | ICD-10-CM | POA: Diagnosis not present

## 2017-06-14 DIAGNOSIS — Z6839 Body mass index (BMI) 39.0-39.9, adult: Secondary | ICD-10-CM | POA: Diagnosis not present

## 2017-06-14 DIAGNOSIS — M722 Plantar fascial fibromatosis: Secondary | ICD-10-CM | POA: Diagnosis not present

## 2017-06-14 DIAGNOSIS — I10 Essential (primary) hypertension: Secondary | ICD-10-CM | POA: Diagnosis not present

## 2017-06-24 DIAGNOSIS — J069 Acute upper respiratory infection, unspecified: Secondary | ICD-10-CM | POA: Diagnosis not present

## 2017-06-24 DIAGNOSIS — J019 Acute sinusitis, unspecified: Secondary | ICD-10-CM | POA: Diagnosis not present

## 2017-07-20 ENCOUNTER — Ambulatory Visit: Payer: PPO | Admitting: Neurology

## 2017-08-03 ENCOUNTER — Encounter: Payer: Self-pay | Admitting: Neurology

## 2017-08-03 ENCOUNTER — Ambulatory Visit (INDEPENDENT_AMBULATORY_CARE_PROVIDER_SITE_OTHER): Payer: PPO | Admitting: Neurology

## 2017-08-03 VITALS — BP 130/64 | HR 104 | Ht 60.0 in | Wt 203.1 lb

## 2017-08-03 DIAGNOSIS — G500 Trigeminal neuralgia: Secondary | ICD-10-CM | POA: Diagnosis not present

## 2017-08-03 MED ORDER — CARBAMAZEPINE 200 MG PO TABS: 200.0000 mg | ORAL_TABLET | Freq: Three times a day (TID) | ORAL | 3 refills | Status: DC

## 2017-08-03 MED ORDER — GABAPENTIN 600 MG PO TABS: ORAL_TABLET | ORAL | 3 refills | Status: DC

## 2017-08-03 NOTE — Progress Notes (Signed)
Follow-up Visit   Date: 08/03/17    Abigail Glover MRN: [id number] DOB: Mar 29, 1946   Interim History: Abigail Glover is a 72 y.o. left-handed Caucasian female with hypertension returning to the clinic for follow-up of left trigeminal neuralgia.  The patient was accompanied to the clinic by self.  History of present illness: She was diagnosed with left trigeminal neuralgia in 2014 by Dr. Metta Clines, which was well-controlled on gabapentin so she had stopped the medication.  Symptoms always started in the left ear and would radiate into the jaw region.  She never had pain involving the left eye, forehead, and only rare into the cheek.  Symptoms were aggravated by cold drink and cold air.  She does not have teeth on the left side, so does not chew on that side or brush her teeth.  Her symptoms were doing great off medication for several years, but in early January 2018, she began having another flare of pain and saw Dr. Tonia Ghent.  She was restarted on gabapentin 1200mg  TID and due to progressive pain, she was placed on carbamazepine 200mg  3-4 times daily which helped control symptoms.  She continues to have mild prickly sharp pain over the left jaw, which constant. MRI brain has not been done.  She has no history of diabetes.  She also complains of left posterior thigh pain which started several weeks ago.  Pain is sharp and non-radiating below the left buttocks.  There is no associated weakness or numbness/tingling.  Pain is present at rest and feels achy, and worse with standing.     UPDATE 08/03/2017:  She is here for 4 month follow-up visit.   She was able to reduce gabapentin and takes 600mg  BID and carbamazepine 200mg  BID.  She has some breakthrough pain with chewing and under stress which usually occurs about once per week, lasting 30-minutes. She has tried taking an extra gabapentin but did not appreciate much improvement.  She is due for teeth cleaning and has delayed this because of  concern that it would exacerbate her pain.    She no longer has left thigh pain, which her PCP confirmed as muscular strain and improved with NSAIDs and tizanidine.  Medications:  Current Outpatient Medications on File Prior to Visit  Medication Sig Dispense Refill  . aspirin EC 81 MG tablet Take 81 mg by mouth daily.    . calcium carbonate (TUMS - DOSED IN MG ELEMENTAL CALCIUM) 500 MG chewable tablet Chew 1 tablet by mouth daily.    . hydrALAZINE (APRESOLINE) 25 MG tablet     . HYDROcodone-acetaminophen (NORCO/VICODIN) 5-325 MG tablet     . losartan (COZAAR) 100 MG tablet     . meloxicam (MOBIC) 15 MG tablet Take 1 tablet (15 mg total) by mouth daily. 30 tablet 1  . omeprazole (PRILOSEC) 10 MG capsule Take 10 mg by mouth.    . terazosin (HYTRIN) 10 MG capsule Take 10 mg by mouth.    . tizanidine (ZANAFLEX) 2 MG capsule Take 1 capsule (2 mg total) by mouth at bedtime as needed for muscle spasms. 30 capsule 0  . UNABLE TO FIND Take by mouth. calcium carbonate (TUMS) 200 mg calcium (500 mg) chewable tablet    . zolpidem (AMBIEN) 10 MG tablet Take 10 mg by mouth at bedtime as needed for sleep.     No current facility-administered medications on file prior to visit.     Allergies:  Allergies  Allergen Reactions  . Amitriptyline   .  Cephalexin   . Penicillin G   . Prednisone   . Seasonal Ic [Cholestatin]     Review of Systems:  CONSTITUTIONAL: No fevers, chills, night sweats, or weight loss.  EYES: No visual changes or eye pain ENT: No hearing changes.  No history of nose bleeds.   RESPIRATORY: No cough, wheezing and shortness of breath.   CARDIOVASCULAR: Negative for chest pain, and palpitations.   GI: Negative for abdominal discomfort, blood in stools or black stools.  No recent change in bowel habits.   GU:  No history of incontinence.   MUSCLOSKELETAL: No history of joint pain or swelling.  No myalgias.   SKIN: Negative for lesions, rash, and itching.   ENDOCRINE: Negative  for cold or heat intolerance, polydipsia or goiter.   PSYCH:  No depression or anxiety symptoms.   NEURO: As Above.   Vital Signs:  BP 130/64   Pulse (!) 104   Ht 5' (1.524 m)   Wt 203 lb 2 oz (92.1 kg)   SpO2 96%   BMI 39.67 kg/m    General: Well appearing, cheerful  Neurological Exam: MENTAL STATUS including orientation to time, place, person, recent and remote memory, attention span and concentration, language, and fund of knowledge is normal.  Speech is not dysarthric.  CRANIAL NERVES: No visual field defects. Pupils equal round and reactive to light.  Normal conjugate, extra-ocular eye movements in all directions of gaze.  No ptosis. Normal facial sensation.  Face is symmetric. Palate elevates symmetrically.  Tongue is midline.  MOTOR:  Motor strength is 5/5 in all extremities  MSRs:  Reflexes are 2+/4 throughout.  SENSORY:  Intact to vibration and temperature.  COORDINATION/GAIT:  Normal finger-to- nose-finger.  Gait narrow based and stable.    IMPRESSION/PLAN: Left trigeminal neuralgia, diagnosed in 2014.  Clinically, she is doing well on lower dose of gabapentin 1200mg /d and carbamazepine 400mg /d but has breakthrough pain about once per week.  She has been delaying her teeth cleaning because of fear that this will trigger her pain and in the past had this performed only when symptoms were stable.  I recommend increasing her carbamazepine to 200mg  TID = 600mg /d and continue gabapentin 600mg  BID.  We can adjust this further as needed, as in the past she has been on gabapentin as high as 3600mg /d.  Return to clinic in 4 months  The duration of this appointment visit was 20 minutes of face-to-face time with the patient.  Greater than 50% of this time was spent in counseling, explanation of diagnosis, planning of further management, and coordination of care.   Thank you for allowing me to participate in patient's care.  If I can answer any additional questions, I would be  pleased to do so.    Sincerely,    Dayna Alia K. Posey Pronto, DO

## 2017-08-03 NOTE — Patient Instructions (Addendum)
Increase carbamazepine 200mg  to three times daily  Continue gabapentin 600mg  twice daily  Return to clinic in 4 months

## 2017-09-12 DIAGNOSIS — M722 Plantar fascial fibromatosis: Secondary | ICD-10-CM | POA: Diagnosis not present

## 2017-09-12 DIAGNOSIS — G4700 Insomnia, unspecified: Secondary | ICD-10-CM | POA: Diagnosis not present

## 2017-09-12 DIAGNOSIS — Z79899 Other long term (current) drug therapy: Secondary | ICD-10-CM | POA: Diagnosis not present

## 2017-09-12 DIAGNOSIS — I10 Essential (primary) hypertension: Secondary | ICD-10-CM | POA: Diagnosis not present

## 2017-09-12 DIAGNOSIS — Z6841 Body Mass Index (BMI) 40.0 and over, adult: Secondary | ICD-10-CM | POA: Diagnosis not present

## 2017-09-12 DIAGNOSIS — Z1331 Encounter for screening for depression: Secondary | ICD-10-CM | POA: Diagnosis not present

## 2017-09-12 DIAGNOSIS — E782 Mixed hyperlipidemia: Secondary | ICD-10-CM | POA: Diagnosis not present

## 2017-12-07 DIAGNOSIS — S70361A Insect bite (nonvenomous), right thigh, initial encounter: Secondary | ICD-10-CM | POA: Diagnosis not present

## 2017-12-13 DIAGNOSIS — M722 Plantar fascial fibromatosis: Secondary | ICD-10-CM | POA: Diagnosis not present

## 2017-12-13 DIAGNOSIS — I10 Essential (primary) hypertension: Secondary | ICD-10-CM | POA: Diagnosis not present

## 2017-12-13 DIAGNOSIS — E782 Mixed hyperlipidemia: Secondary | ICD-10-CM | POA: Diagnosis not present

## 2017-12-13 DIAGNOSIS — E669 Obesity, unspecified: Secondary | ICD-10-CM | POA: Diagnosis not present

## 2017-12-13 DIAGNOSIS — Z6841 Body Mass Index (BMI) 40.0 and over, adult: Secondary | ICD-10-CM | POA: Diagnosis not present

## 2017-12-13 DIAGNOSIS — Z7189 Other specified counseling: Secondary | ICD-10-CM | POA: Diagnosis not present

## 2017-12-13 DIAGNOSIS — Z79899 Other long term (current) drug therapy: Secondary | ICD-10-CM | POA: Diagnosis not present

## 2017-12-13 DIAGNOSIS — Z139 Encounter for screening, unspecified: Secondary | ICD-10-CM | POA: Diagnosis not present

## 2017-12-13 DIAGNOSIS — G4700 Insomnia, unspecified: Secondary | ICD-10-CM | POA: Diagnosis not present

## 2017-12-14 ENCOUNTER — Ambulatory Visit (INDEPENDENT_AMBULATORY_CARE_PROVIDER_SITE_OTHER): Payer: PPO | Admitting: Neurology

## 2017-12-14 ENCOUNTER — Encounter: Payer: Self-pay | Admitting: Neurology

## 2017-12-14 VITALS — BP 140/90 | HR 80 | Ht 60.0 in | Wt 208.1 lb

## 2017-12-14 DIAGNOSIS — G500 Trigeminal neuralgia: Secondary | ICD-10-CM

## 2017-12-14 NOTE — Progress Notes (Signed)
Follow-up Visit   Date: 12/14/17    Abigail Glover MRN: [id number] DOB: [birth year]   Interim History: Abigail Glover is a 72 y.o. left-handed Caucasian female with hypertension returning to the clinic for follow-up of left trigeminal neuralgia.  The patient was accompanied to the clinic by self.  History of present illness: She was diagnosed with left trigeminal neuralgia in 2014 by Dr. Metta Clines, which was well-controlled on gabapentin so she had stopped the medication.  Symptoms always started in the left ear and would radiate into the jaw region.  She never had pain involving the left eye, forehead, and only rare into the cheek.  Symptoms were aggravated by cold drink and cold air.  She does not have teeth on the left side, so does not chew on that side or brush her teeth.  Her symptoms were doing great off medication for several years, but in early January 2018, she began having another flare of pain and saw Dr. Tonia Ghent.  She was restarted on gabapentin 1200mg  TID and due to progressive pain, she was placed on carbamazepine 200mg  3-4 times daily which helped control symptoms.  She continues to have mild prickly sharp pain over the left jaw, which constant. MRI brain has not been done.  She has no history of diabetes.  UPDATE 08/03/2017:    She was able to reduce gabapentin and takes 600mg  BID and carbamazepine 200mg  BID.  She has some breakthrough pain with chewing and under stress which usually occurs about once per week, lasting 30-minutes. She has tried taking an extra gabapentin but did not appreciate much improvement.  She is due for teeth cleaning and has delayed this because of concern that it would exacerbate her pain.    UPDATE 12/14/2017:  She is here for follow-up visit. Overall, she is stable as compared to her last visit and still has have breakthrough facial pain about every other day, lasting several minutes.  She takes gabapentin 600mg  twice daily and carbamazepine 200mg   three times daily. No side effects of sedation, dizziness, or confusion.  She was diagnosed with Lyme disease and started on doxycycline, but has been experiencing nausea related to the antibiotic.    Medications:  Current Outpatient Medications on File Prior to Visit  Medication Sig Dispense Refill  . aspirin EC 81 MG tablet Take 81 mg by mouth daily.    . calcium carbonate (TUMS - DOSED IN MG ELEMENTAL CALCIUM) 500 MG chewable tablet Chew 1 tablet by mouth daily.    . carbamazepine (TEGRETOL) 200 MG tablet Take 1 tablet (200 mg total) by mouth 3 (three) times daily. (Patient taking differently: Take 200 mg by mouth 2 (two) times daily. ) 270 tablet 3  . gabapentin (NEURONTIN) 600 MG tablet Take 600mg  twice daily, okay to take extra dose as needed. 210 tablet 3  . hydrALAZINE (APRESOLINE) 25 MG tablet     . HYDROcodone-acetaminophen (NORCO/VICODIN) 5-325 MG tablet     . losartan (COZAAR) 100 MG tablet     . omeprazole (PRILOSEC) 10 MG capsule Take 10 mg by mouth.    . terazosin (HYTRIN) 10 MG capsule Take 10 mg by mouth.    Marland Kitchen UNABLE TO FIND Take by mouth. calcium carbonate (TUMS) 200 mg calcium (500 mg) chewable tablet    . zolpidem (AMBIEN) 10 MG tablet Take 10 mg by mouth at bedtime as needed for sleep.     No current facility-administered medications on file prior to visit.  Allergies:  Allergies  Allergen Reactions  . Amitriptyline   . Cephalexin   . Penicillin G   . Prednisone   . Seasonal Ic [Cholestatin]     Review of Systems:  CONSTITUTIONAL: No fevers, chills, night sweats, or weight loss.  EYES: No visual changes or eye pain ENT: No hearing changes.  No history of nose bleeds.   RESPIRATORY: No cough, wheezing and shortness of breath.   CARDIOVASCULAR: Negative for chest pain, and palpitations.   GI: Negative for abdominal discomfort, blood in stools or black stools.  No recent change in bowel habits.  +nausea GU:  No history of incontinence.   MUSCLOSKELETAL: No  history of joint pain or swelling.  No myalgias.   SKIN: Negative for lesions, + rash, and itching.   ENDOCRINE: Negative for cold or heat intolerance, polydipsia or goiter.   PSYCH:  No depression or anxiety symptoms.   NEURO: As Above.   Vital Signs:  BP 140/90   Pulse 80   Ht 5' (1.524 m)   Wt 208 lb 2 oz (94.4 kg)   SpO2 98%   BMI 40.65 kg/m    General Medical Exam:   General:  Well appearing, comfortable   Neurological Exam: MENTAL STATUS including orientation to time, place, person, recent and remote memory, attention span and concentration, language, and fund of knowledge is normal.  Speech is not dysarthric.  CRANIAL NERVES:  Pupils equal round and reactive to light.  Normal conjugate, extra-ocular eye movements in all directions of gaze. Mild left ptosis (old). Face is symmetric. Facial sensation intact. Palate elevates symmetrically.  Tongue is midline.  MOTOR:  Motor strength is 5/5 in all extremities.  No pronator drift.  Tone is normal.    COORDINATION/GAIT:  Normal finger-to- nose-finger.  Gait narrow based and stable.   IMPRESSION/PLAN: Left trigeminal neuralgia, diagnosed in 2014.  Clinically, pain is tolerable, but still has frequent breakthrough pain with chewing.  - Increase carbamazepine to 400mg  twice daily  - Continue gabapentin 600mg  twice daily  Drug-related nausea, patient was asked to call her PCP's office   Return to clinic in 4 months   Thank you for allowing me to participate in patient's care.  If I can answer any additional questions, I would be pleased to do so.    Sincerely,    Zaela Graley K. Posey Pronto, DO

## 2017-12-14 NOTE — Patient Instructions (Addendum)
Increase carbamazepine to 400mg  twice daily (you can take two of your 200mg  tablet)  Continue gabapentin 600mg  twice daily  If you are tolerating this, call my office for a new prescription  Return to clinic in 5 months

## 2018-03-15 DIAGNOSIS — G500 Trigeminal neuralgia: Secondary | ICD-10-CM | POA: Diagnosis not present

## 2018-03-15 DIAGNOSIS — Z2821 Immunization not carried out because of patient refusal: Secondary | ICD-10-CM | POA: Diagnosis not present

## 2018-03-15 DIAGNOSIS — Z6841 Body Mass Index (BMI) 40.0 and over, adult: Secondary | ICD-10-CM | POA: Diagnosis not present

## 2018-03-15 DIAGNOSIS — E669 Obesity, unspecified: Secondary | ICD-10-CM | POA: Diagnosis not present

## 2018-03-15 DIAGNOSIS — Z79899 Other long term (current) drug therapy: Secondary | ICD-10-CM | POA: Diagnosis not present

## 2018-03-15 DIAGNOSIS — K219 Gastro-esophageal reflux disease without esophagitis: Secondary | ICD-10-CM | POA: Diagnosis not present

## 2018-03-15 DIAGNOSIS — I10 Essential (primary) hypertension: Secondary | ICD-10-CM | POA: Diagnosis not present

## 2018-03-15 DIAGNOSIS — L719 Rosacea, unspecified: Secondary | ICD-10-CM | POA: Diagnosis not present

## 2018-03-15 DIAGNOSIS — G4700 Insomnia, unspecified: Secondary | ICD-10-CM | POA: Diagnosis not present

## 2018-03-15 DIAGNOSIS — M722 Plantar fascial fibromatosis: Secondary | ICD-10-CM | POA: Diagnosis not present

## 2018-03-15 DIAGNOSIS — Z1339 Encounter for screening examination for other mental health and behavioral disorders: Secondary | ICD-10-CM | POA: Diagnosis not present

## 2018-03-15 DIAGNOSIS — E782 Mixed hyperlipidemia: Secondary | ICD-10-CM | POA: Diagnosis not present

## 2018-05-01 ENCOUNTER — Other Ambulatory Visit: Payer: Self-pay | Admitting: Neurology

## 2018-06-14 DIAGNOSIS — M722 Plantar fascial fibromatosis: Secondary | ICD-10-CM | POA: Diagnosis not present

## 2018-06-14 DIAGNOSIS — Z6841 Body Mass Index (BMI) 40.0 and over, adult: Secondary | ICD-10-CM | POA: Diagnosis not present

## 2018-06-14 DIAGNOSIS — I10 Essential (primary) hypertension: Secondary | ICD-10-CM | POA: Diagnosis not present

## 2018-06-14 DIAGNOSIS — Z9181 History of falling: Secondary | ICD-10-CM | POA: Diagnosis not present

## 2018-06-14 DIAGNOSIS — Z79899 Other long term (current) drug therapy: Secondary | ICD-10-CM | POA: Diagnosis not present

## 2018-06-14 DIAGNOSIS — E669 Obesity, unspecified: Secondary | ICD-10-CM | POA: Diagnosis not present

## 2018-06-14 DIAGNOSIS — E782 Mixed hyperlipidemia: Secondary | ICD-10-CM | POA: Diagnosis not present

## 2018-06-14 DIAGNOSIS — G4700 Insomnia, unspecified: Secondary | ICD-10-CM | POA: Diagnosis not present

## 2018-06-14 DIAGNOSIS — K219 Gastro-esophageal reflux disease without esophagitis: Secondary | ICD-10-CM | POA: Diagnosis not present

## 2018-06-17 ENCOUNTER — Ambulatory Visit: Payer: PPO | Admitting: Neurology

## 2018-06-28 ENCOUNTER — Encounter: Payer: Self-pay | Admitting: Neurology

## 2018-06-28 ENCOUNTER — Ambulatory Visit (INDEPENDENT_AMBULATORY_CARE_PROVIDER_SITE_OTHER): Payer: PPO | Admitting: Neurology

## 2018-06-28 VITALS — BP 140/80 | HR 92 | Ht 60.0 in | Wt 211.4 lb

## 2018-06-28 DIAGNOSIS — G500 Trigeminal neuralgia: Secondary | ICD-10-CM

## 2018-06-28 DIAGNOSIS — Z79899 Other long term (current) drug therapy: Secondary | ICD-10-CM | POA: Diagnosis not present

## 2018-06-28 NOTE — Patient Instructions (Addendum)
Reduce carbamazepine to 200mg  twice daily  You can ask your pharmacy about the cost of oxcarbamazepine 150mg  twice daily  Please call my office and let me know what dose and how much gabapentin you are taking  Return to clinic in 4 months

## 2018-06-28 NOTE — Progress Notes (Signed)
Follow-up Visit   Date: 06/28/18    Abigail Glover MRN: [id number] DOB: 06-25-1946   Interim History: Abigail Glover is a 72 y.o. left-handed Caucasian female with hypertension returning to the clinic for follow-up of left trigeminal neuralgia.  The patient was accompanied to the clinic by self.  History of present illness: She was diagnosed with left trigeminal neuralgia in 2014 by Dr. Metta Clines, which was well-controlled on gabapentin so she had stopped the medication.  Symptoms always started in the left ear and would radiate into the jaw region.  She never had pain involving the left eye, forehead, and only rare into the cheek.  Symptoms were aggravated by cold drink and cold air.  She does not have teeth on the left side, so does not chew on that side or brush her teeth.  Her symptoms were doing great off medication for several years, but in early January 2018, she began having another flare of pain and saw Dr. Tonia Ghent.  She was restarted on gabapentin 1200mg  TID and due to progressive pain, she was placed on carbamazepine 200mg  3-4 times daily which helped control symptoms.  She continues to have mild prickly sharp pain over the left jaw, which constant. MRI brain has not been done.  She has no history of diabetes.  UPDATE 06/28/2018:  She is here for follow-up for left trigeminal neuralgia. At her last visit, I increased carbamazepine to 400mg  BID due to worsening facial pain, but she self-tapered this due to cognitive side effects.  She tolerates gabapentin better, but is unsure whether she is taking 1200mg  BID or 600mg  BID, as my last notes indicate she should be taking 600mg  BID.  She is taking (2) tablets twice daily, unsure of what milligram the tablets are.  Overall, her pain is stable with intermittent spells of severe pain, such as when drinking or loud noises can trigger it.   Medications:  Current Outpatient Medications on File Prior to Visit  Medication Sig Dispense  Refill  . aspirin EC 81 MG tablet Take 81 mg by mouth daily.    . calcium carbonate (TUMS - DOSED IN MG ELEMENTAL CALCIUM) 500 MG chewable tablet Chew 1 tablet by mouth daily.    . carbamazepine (TEGRETOL) 200 MG tablet Take 1 tablet (200 mg total) by mouth 3 (three) times daily. (Patient taking differently: Take 400 mg by mouth 2 (two) times daily. ) 270 tablet 3  . gabapentin (NEURONTIN) 600 MG tablet TAKE ONE TABLET BY MOUTH TWICE DAILY OKAY TO TAKE EXTRA DOSE AS NEEDED 210 tablet 3  . hydrALAZINE (APRESOLINE) 25 MG tablet     . HYDROcodone-acetaminophen (NORCO/VICODIN) 5-325 MG tablet     . losartan (COZAAR) 100 MG tablet     . omeprazole (PRILOSEC) 10 MG capsule Take 10 mg by mouth.    . terazosin (HYTRIN) 10 MG capsule Take 10 mg by mouth.    Marland Kitchen UNABLE TO FIND Take by mouth. calcium carbonate (TUMS) 200 mg calcium (500 mg) chewable tablet    . zolpidem (AMBIEN) 10 MG tablet Take 10 mg by mouth at bedtime as needed for sleep.     No current facility-administered medications on file prior to visit.     Allergies:  Allergies  Allergen Reactions  . Amitriptyline   . Cephalexin   . Penicillin G   . Prednisone   . Seasonal Ic [Cholestatin]     Review of Systems:  CONSTITUTIONAL: No fevers, chills, night sweats, or weight loss.  EYES: No visual changes or eye pain ENT: No hearing changes.  No history of nose bleeds.   RESPIRATORY: No cough, wheezing and shortness of breath.   CARDIOVASCULAR: Negative for chest pain, and palpitations.   GI: Negative for abdominal discomfort, blood in stools or black stools.  No recent change in bowel habits.  +nausea GU:  No history of incontinence.   MUSCLOSKELETAL: No history of joint pain or swelling.  No myalgias.   SKIN: Negative for lesions, + rash, and itching.   ENDOCRINE: Negative for cold or heat intolerance, polydipsia or goiter.   PSYCH:  No depression or anxiety symptoms.   NEURO: As Above.   Vital Signs:  BP 140/80   Pulse 92    Ht 5' (1.524 m)   Wt 211 lb 6 oz (95.9 kg)   SpO2 96%   BMI 41.28 kg/m    General Medical Exam:   General:  Well appearing, comfortable  Eyes/ENT: see cranial nerve examination.   Neck:  No carotid bruits. Respiratory:  Clear to auscultation, good air entry bilaterally.   Cardiac:  Regular rate and rhythm, no murmur.   Ext:  No edema  Neurological Exam: MENTAL STATUS including orientation to time, place, person, recent and remote memory, attention span and concentration, language, and fund of knowledge is normal.  Speech is not dysarthric.  CRANIAL NERVES:  Pupils equal round and reactive to light.  Normal conjugate, extra-ocular eye movements in all directions of gaze. Mild left ptosis (old). Face is symmetric. Facial sensation intact. Palate elevates symmetrically.  Tongue is midline.  MOTOR:  Motor strength is 5/5 in all extremities.  No pronator drift.  Tone is normal.    COORDINATION/GAIT:  Normal finger-to- nose-finger.  Gait narrow based and stable.   IMPRESSION/PLAN: Left trigeminal neuralgia, diagnosed in 2014. Overall stable with intermittent spells of breakthrough pain.  She is having cognitive side effects with carbamazepine so this will be reduced back down to 200mg  BID.  I have asked her to find out the cost of oxcarbamazepine, if affordable, I will transition her to this.   Regarding her gabapentin, she will call the office with the dose of gabapentin tablets she is taking and I will decide further adjustments from there.  If she is taking 600mg  BID, increase to 900mg  BID; if she is on 1200mg  BID, increase to 1500mg  BID.   Return to clinic in 4 months   Thank you for allowing me to participate in patient's care.  If I can answer any additional questions, I would be pleased to do so.    Sincerely,    Donika K. Posey Pronto, DO

## 2018-07-02 ENCOUNTER — Telehealth: Payer: Self-pay | Admitting: Neurology

## 2018-07-02 ENCOUNTER — Other Ambulatory Visit: Payer: Self-pay | Admitting: *Deleted

## 2018-07-02 MED ORDER — GABAPENTIN 600 MG PO TABS: ORAL_TABLET | ORAL | 3 refills | Status: DC

## 2018-07-02 NOTE — Telephone Encounter (Signed)
FYI

## 2018-07-02 NOTE — Telephone Encounter (Signed)
Patient is calling in stating she take the gabapentin medication 600mg  2 per day. She said she was asked to call in and give this info. Thanks!

## 2018-07-02 NOTE — Telephone Encounter (Signed)
Attempted to contact patient but voicemail is full.

## 2018-07-02 NOTE — Telephone Encounter (Signed)
I called patient's pharmacy since I was unable to reach patient and they said that she is getting the 600 mg tablets and taking 1 po bid and ok to take 1 extra if needed.

## 2018-07-02 NOTE — Telephone Encounter (Signed)
Letter sent to patient about increase in medication dosage and Rx sent to the pharmacy.

## 2018-07-02 NOTE — Telephone Encounter (Signed)
We can increase gabapentin to 900mg  twice daily - take 1.5 tab (600mg  tab) twice daily.  Please send new Rx and inform patient.    Thanks.

## 2018-07-02 NOTE — Telephone Encounter (Signed)
Abigail Glover, please clarify the dose of the tablet (300mg  vs 600mg ) and how many tablets in the morning and night.  Thanks.

## 2018-07-23 ENCOUNTER — Telehealth: Payer: Self-pay | Admitting: Neurology

## 2018-07-23 NOTE — Telephone Encounter (Signed)
Patient called about emergency dentist appt and her DX. She has some medication questions. Please give her a call back at [phone] may have to call multiple times as she is experiencing phone problems. Thanks!

## 2018-07-23 NOTE — Telephone Encounter (Signed)
Patient called about having her teeth cleaned.  She said that she has been putting it off due to her trigeminal neuralgia.  I instructed her to call if she has terrible pain and may need to increase the gabapentin and to also discuss pain management with her dentist.

## 2018-07-26 ENCOUNTER — Telehealth: Payer: Self-pay | Admitting: Neurology

## 2018-07-26 NOTE — Telephone Encounter (Signed)
I called patient's pharmacist and he said that patient needs to call the insurance company to find out the preferred pharmacy and drug.  Attempted to contact patient.  No answer and no voicemail.

## 2018-07-26 NOTE — Telephone Encounter (Signed)
Needs to talk to someone about medication gabapentin. She needs [place] to call the ins company to get the copay changed to a lower one by calling [phone].

## 2018-07-27 DIAGNOSIS — S39012A Strain of muscle, fascia and tendon of lower back, initial encounter: Secondary | ICD-10-CM | POA: Diagnosis not present

## 2018-07-30 DIAGNOSIS — R69 Illness, unspecified: Secondary | ICD-10-CM | POA: Diagnosis not present

## 2018-08-05 DIAGNOSIS — M609 Myositis, unspecified: Secondary | ICD-10-CM | POA: Diagnosis not present

## 2018-08-06 ENCOUNTER — Telehealth: Payer: Self-pay | Admitting: Neurology

## 2018-08-06 NOTE — Telephone Encounter (Signed)
Patient calling in about the gabapentin medication and the insurance approval for tier 3. They were only approving a tier 2 and needing to get authorization to change tiers. Please call her back at [phone]. Thanks!

## 2018-08-07 NOTE — Telephone Encounter (Signed)
Spoke with patient - made her aware I tried to submit a tier exception request and it would not submit. She changed insurance and we did not have the right information on file.  New insurance - [employer] under patient's name.  Member ID# Charmaine Downs Group# [id number] RX bin# [id number] RX PCN# [id number] SP group# [id number]  We will try to submit tier exception request now that we have appropriate information.

## 2018-08-07 NOTE — Telephone Encounter (Signed)
Tier exception request submitted.

## 2018-08-08 ENCOUNTER — Telehealth: Payer: Self-pay | Admitting: *Deleted

## 2018-08-08 NOTE — Telephone Encounter (Signed)
Gabapentin: 07/02/2018 to 07/03/2019.

## 2018-08-09 DIAGNOSIS — M7601 Gluteal tendinitis, right hip: Secondary | ICD-10-CM | POA: Diagnosis not present

## 2018-08-17 ENCOUNTER — Other Ambulatory Visit: Payer: Self-pay | Admitting: Neurology

## 2018-09-13 DIAGNOSIS — E669 Obesity, unspecified: Secondary | ICD-10-CM | POA: Diagnosis not present

## 2018-09-13 DIAGNOSIS — Z79899 Other long term (current) drug therapy: Secondary | ICD-10-CM | POA: Diagnosis not present

## 2018-09-13 DIAGNOSIS — Z6841 Body Mass Index (BMI) 40.0 and over, adult: Secondary | ICD-10-CM | POA: Diagnosis not present

## 2018-09-13 DIAGNOSIS — G4700 Insomnia, unspecified: Secondary | ICD-10-CM | POA: Diagnosis not present

## 2018-09-13 DIAGNOSIS — Z9181 History of falling: Secondary | ICD-10-CM | POA: Diagnosis not present

## 2018-09-13 DIAGNOSIS — K219 Gastro-esophageal reflux disease without esophagitis: Secondary | ICD-10-CM | POA: Diagnosis not present

## 2018-09-13 DIAGNOSIS — M722 Plantar fascial fibromatosis: Secondary | ICD-10-CM | POA: Diagnosis not present

## 2018-09-13 DIAGNOSIS — I10 Essential (primary) hypertension: Secondary | ICD-10-CM | POA: Diagnosis not present

## 2018-09-13 DIAGNOSIS — E782 Mixed hyperlipidemia: Secondary | ICD-10-CM | POA: Diagnosis not present

## 2018-10-30 DIAGNOSIS — Z6839 Body mass index (BMI) 39.0-39.9, adult: Secondary | ICD-10-CM | POA: Diagnosis not present

## 2018-10-30 DIAGNOSIS — K219 Gastro-esophageal reflux disease without esophagitis: Secondary | ICD-10-CM | POA: Diagnosis not present

## 2018-11-08 ENCOUNTER — Ambulatory Visit: Payer: PPO | Admitting: Neurology

## 2018-11-08 DIAGNOSIS — K589 Irritable bowel syndrome without diarrhea: Secondary | ICD-10-CM | POA: Diagnosis not present

## 2018-11-14 DIAGNOSIS — R1013 Epigastric pain: Secondary | ICD-10-CM | POA: Diagnosis not present

## 2018-11-20 DIAGNOSIS — R140 Abdominal distension (gaseous): Secondary | ICD-10-CM | POA: Diagnosis not present

## 2018-11-20 DIAGNOSIS — I10 Essential (primary) hypertension: Secondary | ICD-10-CM | POA: Diagnosis not present

## 2018-11-20 DIAGNOSIS — R630 Anorexia: Secondary | ICD-10-CM | POA: Diagnosis not present

## 2018-11-20 DIAGNOSIS — Z79899 Other long term (current) drug therapy: Secondary | ICD-10-CM | POA: Diagnosis not present

## 2018-11-20 DIAGNOSIS — K2970 Gastritis, unspecified, without bleeding: Secondary | ICD-10-CM | POA: Diagnosis not present

## 2018-11-20 DIAGNOSIS — Z7982 Long term (current) use of aspirin: Secondary | ICD-10-CM | POA: Diagnosis not present

## 2018-11-20 DIAGNOSIS — R109 Unspecified abdominal pain: Secondary | ICD-10-CM | POA: Diagnosis not present

## 2018-11-20 DIAGNOSIS — Z20828 Contact with and (suspected) exposure to other viral communicable diseases: Secondary | ICD-10-CM | POA: Diagnosis not present

## 2018-11-20 DIAGNOSIS — K219 Gastro-esophageal reflux disease without esophagitis: Secondary | ICD-10-CM | POA: Diagnosis not present

## 2018-11-20 DIAGNOSIS — R638 Other symptoms and signs concerning food and fluid intake: Secondary | ICD-10-CM | POA: Diagnosis not present

## 2018-11-20 DIAGNOSIS — R142 Eructation: Secondary | ICD-10-CM | POA: Diagnosis not present

## 2018-11-20 DIAGNOSIS — R1013 Epigastric pain: Secondary | ICD-10-CM | POA: Diagnosis not present

## 2018-11-21 DIAGNOSIS — R1013 Epigastric pain: Secondary | ICD-10-CM | POA: Diagnosis not present

## 2018-11-30 DIAGNOSIS — K8689 Other specified diseases of pancreas: Secondary | ICD-10-CM | POA: Diagnosis not present

## 2018-11-30 DIAGNOSIS — R1013 Epigastric pain: Secondary | ICD-10-CM | POA: Diagnosis not present

## 2018-12-02 DIAGNOSIS — K219 Gastro-esophageal reflux disease without esophagitis: Secondary | ICD-10-CM | POA: Diagnosis not present

## 2018-12-02 DIAGNOSIS — R1013 Epigastric pain: Secondary | ICD-10-CM | POA: Diagnosis not present

## 2018-12-05 DIAGNOSIS — K8010 Calculus of gallbladder with chronic cholecystitis without obstruction: Secondary | ICD-10-CM | POA: Diagnosis not present

## 2018-12-09 DIAGNOSIS — E785 Hyperlipidemia, unspecified: Secondary | ICD-10-CM | POA: Diagnosis not present

## 2018-12-09 DIAGNOSIS — G500 Trigeminal neuralgia: Secondary | ICD-10-CM | POA: Diagnosis not present

## 2018-12-09 DIAGNOSIS — M1990 Unspecified osteoarthritis, unspecified site: Secondary | ICD-10-CM | POA: Diagnosis not present

## 2018-12-09 DIAGNOSIS — K811 Chronic cholecystitis: Secondary | ICD-10-CM | POA: Diagnosis not present

## 2018-12-09 DIAGNOSIS — R69 Illness, unspecified: Secondary | ICD-10-CM | POA: Diagnosis not present

## 2018-12-09 DIAGNOSIS — I10 Essential (primary) hypertension: Secondary | ICD-10-CM | POA: Diagnosis not present

## 2018-12-09 DIAGNOSIS — K219 Gastro-esophageal reflux disease without esophagitis: Secondary | ICD-10-CM | POA: Diagnosis not present

## 2018-12-09 DIAGNOSIS — K8010 Calculus of gallbladder with chronic cholecystitis without obstruction: Secondary | ICD-10-CM | POA: Diagnosis not present

## 2018-12-09 DIAGNOSIS — E669 Obesity, unspecified: Secondary | ICD-10-CM | POA: Diagnosis not present

## 2018-12-09 DIAGNOSIS — Z87891 Personal history of nicotine dependence: Secondary | ICD-10-CM | POA: Diagnosis not present

## 2018-12-09 DIAGNOSIS — K8020 Calculus of gallbladder without cholecystitis without obstruction: Secondary | ICD-10-CM | POA: Diagnosis not present

## 2018-12-09 DIAGNOSIS — G309 Alzheimer's disease, unspecified: Secondary | ICD-10-CM | POA: Diagnosis not present

## 2018-12-15 DIAGNOSIS — J029 Acute pharyngitis, unspecified: Secondary | ICD-10-CM | POA: Diagnosis not present

## 2018-12-20 DIAGNOSIS — Z09 Encounter for follow-up examination after completed treatment for conditions other than malignant neoplasm: Secondary | ICD-10-CM | POA: Diagnosis not present

## 2018-12-26 DIAGNOSIS — R42 Dizziness and giddiness: Secondary | ICD-10-CM | POA: Diagnosis not present

## 2018-12-26 DIAGNOSIS — E860 Dehydration: Secondary | ICD-10-CM | POA: Diagnosis not present

## 2018-12-26 DIAGNOSIS — Z6835 Body mass index (BMI) 35.0-35.9, adult: Secondary | ICD-10-CM | POA: Diagnosis not present

## 2018-12-26 DIAGNOSIS — I10 Essential (primary) hypertension: Secondary | ICD-10-CM | POA: Diagnosis not present

## 2019-01-01 DIAGNOSIS — Z09 Encounter for follow-up examination after completed treatment for conditions other than malignant neoplasm: Secondary | ICD-10-CM | POA: Diagnosis not present

## 2019-01-08 DIAGNOSIS — R7989 Other specified abnormal findings of blood chemistry: Secondary | ICD-10-CM | POA: Diagnosis not present

## 2019-01-08 DIAGNOSIS — Z79899 Other long term (current) drug therapy: Secondary | ICD-10-CM | POA: Diagnosis not present

## 2019-01-08 DIAGNOSIS — Z6836 Body mass index (BMI) 36.0-36.9, adult: Secondary | ICD-10-CM | POA: Diagnosis not present

## 2019-01-08 DIAGNOSIS — K219 Gastro-esophageal reflux disease without esophagitis: Secondary | ICD-10-CM | POA: Diagnosis not present

## 2019-01-08 DIAGNOSIS — Z9181 History of falling: Secondary | ICD-10-CM | POA: Diagnosis not present

## 2019-01-08 DIAGNOSIS — G4700 Insomnia, unspecified: Secondary | ICD-10-CM | POA: Diagnosis not present

## 2019-01-08 DIAGNOSIS — M722 Plantar fascial fibromatosis: Secondary | ICD-10-CM | POA: Diagnosis not present

## 2019-01-08 DIAGNOSIS — E669 Obesity, unspecified: Secondary | ICD-10-CM | POA: Diagnosis not present

## 2019-01-08 DIAGNOSIS — Z6841 Body Mass Index (BMI) 40.0 and over, adult: Secondary | ICD-10-CM | POA: Diagnosis not present

## 2019-01-08 DIAGNOSIS — E782 Mixed hyperlipidemia: Secondary | ICD-10-CM | POA: Diagnosis not present

## 2019-01-08 DIAGNOSIS — I10 Essential (primary) hypertension: Secondary | ICD-10-CM | POA: Diagnosis not present

## 2019-01-09 ENCOUNTER — Encounter: Payer: Self-pay | Admitting: Neurology

## 2019-01-10 ENCOUNTER — Telehealth (INDEPENDENT_AMBULATORY_CARE_PROVIDER_SITE_OTHER): Payer: Medicare HMO | Admitting: Neurology

## 2019-01-10 ENCOUNTER — Other Ambulatory Visit: Payer: Self-pay

## 2019-01-10 VITALS — Ht 60.0 in | Wt 185.0 lb

## 2019-01-10 DIAGNOSIS — G500 Trigeminal neuralgia: Secondary | ICD-10-CM | POA: Diagnosis not present

## 2019-01-10 NOTE — Progress Notes (Signed)
Appointment scheduled.

## 2019-01-10 NOTE — Progress Notes (Signed)
    Virtual Visit via Telephone Note The purpose of this virtual visit is to provide medical care while limiting exposure to the novel coronavirus.    Consent was obtained for phone visit:  Yes.   Answered questions that patient had about telehealth interaction:  Yes.   I discussed the limitations, risks, security and privacy concerns of performing an evaluation and management service by telephone. I also discussed with the patient that there may be a patient responsible charge related to this service. The patient expressed understanding and agreed to proceed.  Pt location: Home Physician Location: office Name of referring provider:  Practice, [facility] connected with .Lindi Adie at patients initiation/request on 01/10/2019 at  1:50 PM EDT by telephone and verified that I am speaking with the correct person using two identifiers.  Pt MRN:  [id number] Pt DOB:  09/12/1945   History of Present Illness: This is a 73 year-old returning for follow-up of left trigeminal neuralgia.  Pain was doing much better, occurring about once every 2-4 weeks and resolves within a day.  Symptoms did get worse on July 4th because her neighbor had a lot of fireworks and that triggered it.  Over the past few days, pain has significantly improved.  She is taking gabapentin 900mg  BID and carbamazepine 200mg  twice daily and feels that this regimen has very good pain control.  Assessment and Plan:   Left trigeminal neuralgia, stable  - Continue gabapentin 900mg  BID and carbamazepine 200mg  BID  - She may need PA for gabapentin for calendar year 2021 as this caused delay in getting her medication this year  Follow Up Instructions:   I discussed the assessment and treatment plan with the patient. The patient was provided an opportunity to ask questions and all were answered. The patient agreed with the plan and demonstrated an understanding of the instructions.   The patient was advised to call back or seek  an in-person evaluation if the symptoms worsen or if the condition fails to improve as anticipated.  Return to clinic in 5 months   Total Time spent in visit with the patient was:  15 min, of which 100% of the time was spent in counseling and/or coordinating care.   Pt understands and agrees with the plan of care outlined.     Alda Berthold, DO

## 2019-03-18 DIAGNOSIS — R1013 Epigastric pain: Secondary | ICD-10-CM | POA: Diagnosis not present

## 2019-04-09 DIAGNOSIS — R634 Abnormal weight loss: Secondary | ICD-10-CM | POA: Diagnosis not present

## 2019-04-09 DIAGNOSIS — Z6833 Body mass index (BMI) 33.0-33.9, adult: Secondary | ICD-10-CM | POA: Diagnosis not present

## 2019-04-09 DIAGNOSIS — R1013 Epigastric pain: Secondary | ICD-10-CM | POA: Diagnosis not present

## 2019-04-09 DIAGNOSIS — Z139 Encounter for screening, unspecified: Secondary | ICD-10-CM | POA: Diagnosis not present

## 2019-04-09 DIAGNOSIS — E669 Obesity, unspecified: Secondary | ICD-10-CM | POA: Diagnosis not present

## 2019-04-09 DIAGNOSIS — M722 Plantar fascial fibromatosis: Secondary | ICD-10-CM | POA: Diagnosis not present

## 2019-04-09 DIAGNOSIS — Z79899 Other long term (current) drug therapy: Secondary | ICD-10-CM | POA: Diagnosis not present

## 2019-04-10 DIAGNOSIS — R932 Abnormal findings on diagnostic imaging of liver and biliary tract: Secondary | ICD-10-CM | POA: Diagnosis not present

## 2019-04-10 DIAGNOSIS — R1013 Epigastric pain: Secondary | ICD-10-CM | POA: Diagnosis not present

## 2019-04-23 DIAGNOSIS — H2513 Age-related nuclear cataract, bilateral: Secondary | ICD-10-CM | POA: Diagnosis not present

## 2019-04-29 DIAGNOSIS — R109 Unspecified abdominal pain: Secondary | ICD-10-CM | POA: Diagnosis not present

## 2019-04-29 DIAGNOSIS — K5903 Drug induced constipation: Secondary | ICD-10-CM | POA: Diagnosis not present

## 2019-05-15 DIAGNOSIS — Z6832 Body mass index (BMI) 32.0-32.9, adult: Secondary | ICD-10-CM | POA: Diagnosis not present

## 2019-05-15 DIAGNOSIS — E669 Obesity, unspecified: Secondary | ICD-10-CM | POA: Diagnosis not present

## 2019-05-15 DIAGNOSIS — E782 Mixed hyperlipidemia: Secondary | ICD-10-CM | POA: Diagnosis not present

## 2019-05-15 DIAGNOSIS — G500 Trigeminal neuralgia: Secondary | ICD-10-CM | POA: Diagnosis not present

## 2019-05-15 DIAGNOSIS — Z79899 Other long term (current) drug therapy: Secondary | ICD-10-CM | POA: Diagnosis not present

## 2019-05-15 DIAGNOSIS — I10 Essential (primary) hypertension: Secondary | ICD-10-CM | POA: Diagnosis not present

## 2019-05-15 DIAGNOSIS — M722 Plantar fascial fibromatosis: Secondary | ICD-10-CM | POA: Diagnosis not present

## 2019-05-15 DIAGNOSIS — G4700 Insomnia, unspecified: Secondary | ICD-10-CM | POA: Diagnosis not present

## 2019-05-15 DIAGNOSIS — N1831 Chronic kidney disease, stage 3a: Secondary | ICD-10-CM | POA: Diagnosis not present

## 2019-05-15 DIAGNOSIS — K219 Gastro-esophageal reflux disease without esophagitis: Secondary | ICD-10-CM | POA: Diagnosis not present

## 2019-06-05 DIAGNOSIS — K581 Irritable bowel syndrome with constipation: Secondary | ICD-10-CM | POA: Diagnosis not present

## 2019-06-05 DIAGNOSIS — Z1211 Encounter for screening for malignant neoplasm of colon: Secondary | ICD-10-CM | POA: Diagnosis not present

## 2019-06-16 ENCOUNTER — Telehealth: Payer: Medicare HMO | Admitting: Neurology

## 2019-06-18 ENCOUNTER — Other Ambulatory Visit: Payer: Self-pay

## 2019-06-18 ENCOUNTER — Telehealth (INDEPENDENT_AMBULATORY_CARE_PROVIDER_SITE_OTHER): Payer: Medicare HMO | Admitting: Neurology

## 2019-06-18 DIAGNOSIS — G500 Trigeminal neuralgia: Secondary | ICD-10-CM | POA: Diagnosis not present

## 2019-06-18 MED ORDER — CARBAMAZEPINE 200 MG PO TABS: 200.0000 mg | ORAL_TABLET | Freq: Two times a day (BID) | ORAL | 3 refills | Status: DC

## 2019-06-18 MED ORDER — GABAPENTIN 600 MG PO TABS: ORAL_TABLET | ORAL | 3 refills | Status: DC

## 2019-06-18 NOTE — Progress Notes (Signed)
Follow up scheduled

## 2019-06-18 NOTE — Progress Notes (Signed)
    Virtual Visit via Telephone Note The purpose of this virtual visit is to provide medical care while limiting exposure to the novel coronavirus.    Consent was obtained for phone visit:  Yes.   Answered questions that patient had about telehealth interaction:  Yes.   I discussed the limitations, risks, security and privacy concerns of performing an evaluation and management service by telephone. I also discussed with the patient that there may be a patient responsible charge related to this service. The patient expressed understanding and agreed to proceed.  Pt location: Home Physician Location: office Name of referring provider:  Practice, [place] connected with .Lindi Adie at patients initiation/request on 06/18/2019 at  9:50 AM EST by telephone and verified that I am speaking with the correct person using two identifiers.  Pt MRN:  [id number] Pt DOB:  1946/05/17   History of Present Illness:  This is a 73 year-old female returning for left trigeminal neuralgia. Overall, her pain is very well-controlled, but tends to be triggered by cold temperature. When pain spells occur, it can last a few days.  She continues to take gabapentin 900mg  BID and carbamazepine 200mg  BID.  During the summer, she underwent cholecystectomy and continues to have mild abdominal discomfort related to this.    Assessment and Plan:   Left trigeminal neuralgia, stable  - Continue gabapentin 900mg  BID and carbamazepine 200mg  BID - refills provided  - Will start PA for gabapentin as this was required in the past and I do not want there to be gap in her medication coverage  Follow Up Instructions:   I discussed the assessment and treatment plan with the patient. The patient was provided an opportunity to ask questions and all were answered. The patient agreed with the plan and demonstrated an understanding of the instructions.   The patient was advised to call back or seek an in-person evaluation if  the symptoms worsen or if the condition fails to improve as anticipated.  Return to clinic in 9 months   Total Time spent in visit with the patient was:  12 min, of which 100% of the time was spent in counseling and/or coordinating care.   Pt understands and agrees with the plan of care outlined.     Alda Berthold, DO

## 2019-06-18 NOTE — Progress Notes (Signed)
[place] exception not needed at this time for gabapentin per pharmacy.

## 2019-07-02 DIAGNOSIS — I129 Hypertensive chronic kidney disease with stage 1 through stage 4 chronic kidney disease, or unspecified chronic kidney disease: Secondary | ICD-10-CM | POA: Diagnosis not present

## 2019-07-02 DIAGNOSIS — R82998 Other abnormal findings in urine: Secondary | ICD-10-CM | POA: Diagnosis not present

## 2019-07-02 DIAGNOSIS — E872 Acidosis: Secondary | ICD-10-CM | POA: Diagnosis not present

## 2019-07-02 DIAGNOSIS — D631 Anemia in chronic kidney disease: Secondary | ICD-10-CM | POA: Diagnosis not present

## 2019-07-02 DIAGNOSIS — N1832 Chronic kidney disease, stage 3b: Secondary | ICD-10-CM | POA: Diagnosis not present

## 2019-07-11 ENCOUNTER — Other Ambulatory Visit: Payer: Self-pay | Admitting: Nephrology

## 2019-07-11 DIAGNOSIS — D631 Anemia in chronic kidney disease: Secondary | ICD-10-CM

## 2019-07-11 DIAGNOSIS — I129 Hypertensive chronic kidney disease with stage 1 through stage 4 chronic kidney disease, or unspecified chronic kidney disease: Secondary | ICD-10-CM

## 2019-07-11 DIAGNOSIS — R82998 Other abnormal findings in urine: Secondary | ICD-10-CM

## 2019-07-11 DIAGNOSIS — N1831 Chronic kidney disease, stage 3a: Secondary | ICD-10-CM

## 2019-07-28 ENCOUNTER — Ambulatory Visit: Payer: Medicare HMO

## 2019-07-30 ENCOUNTER — Ambulatory Visit: Payer: Medicare HMO

## 2019-07-30 DIAGNOSIS — E872 Acidosis: Secondary | ICD-10-CM | POA: Diagnosis not present

## 2019-07-30 DIAGNOSIS — D631 Anemia in chronic kidney disease: Secondary | ICD-10-CM | POA: Diagnosis not present

## 2019-07-30 DIAGNOSIS — N1832 Chronic kidney disease, stage 3b: Secondary | ICD-10-CM | POA: Diagnosis not present

## 2019-07-30 DIAGNOSIS — D529 Folate deficiency anemia, unspecified: Secondary | ICD-10-CM | POA: Diagnosis not present

## 2019-07-30 DIAGNOSIS — I129 Hypertensive chronic kidney disease with stage 1 through stage 4 chronic kidney disease, or unspecified chronic kidney disease: Secondary | ICD-10-CM | POA: Diagnosis not present

## 2019-08-14 ENCOUNTER — Telehealth: Payer: Self-pay | Admitting: Neurology

## 2019-08-14 NOTE — Telephone Encounter (Signed)
Please advise on below  

## 2019-08-14 NOTE — Telephone Encounter (Signed)
Patient called regarding her Gabapentin medication and needing to see if Dr. Posey Pronto can write a letter to her Insurance to  help lower it to a different tier. Patient asked to please call her to let her know if that can be done for her. Thank you

## 2019-08-15 NOTE — Telephone Encounter (Signed)
Please find out cost to patient and start PA for gabapentin.  Insurance will not change coverage level for drugs, but we can see why it is not approved.  Thanks.

## 2019-08-15 NOTE — Telephone Encounter (Signed)
Deductible is $140 dollars, called patient as a insurance card has not been scanned since 2019. Had to leave a message for updated numbers.

## 2019-08-18 NOTE — Telephone Encounter (Signed)
Patient called and confirmed the insurance, [facility], is the same as last year. Ran eligibility and it is still active. Patient does not have any other prescription coverage than that.

## 2019-08-19 ENCOUNTER — Telehealth: Payer: Self-pay | Admitting: Neurology

## 2019-08-19 DIAGNOSIS — M722 Plantar fascial fibromatosis: Secondary | ICD-10-CM | POA: Diagnosis not present

## 2019-08-19 DIAGNOSIS — Z6833 Body mass index (BMI) 33.0-33.9, adult: Secondary | ICD-10-CM | POA: Diagnosis not present

## 2019-08-19 DIAGNOSIS — K219 Gastro-esophageal reflux disease without esophagitis: Secondary | ICD-10-CM | POA: Diagnosis not present

## 2019-08-19 DIAGNOSIS — G500 Trigeminal neuralgia: Secondary | ICD-10-CM | POA: Diagnosis not present

## 2019-08-19 DIAGNOSIS — E669 Obesity, unspecified: Secondary | ICD-10-CM | POA: Diagnosis not present

## 2019-08-19 DIAGNOSIS — I10 Essential (primary) hypertension: Secondary | ICD-10-CM | POA: Diagnosis not present

## 2019-08-19 DIAGNOSIS — R7303 Prediabetes: Secondary | ICD-10-CM | POA: Diagnosis not present

## 2019-08-19 DIAGNOSIS — Z79899 Other long term (current) drug therapy: Secondary | ICD-10-CM | POA: Diagnosis not present

## 2019-08-19 DIAGNOSIS — N1831 Chronic kidney disease, stage 3a: Secondary | ICD-10-CM | POA: Diagnosis not present

## 2019-08-19 DIAGNOSIS — G4700 Insomnia, unspecified: Secondary | ICD-10-CM | POA: Diagnosis not present

## 2019-08-19 DIAGNOSIS — E782 Mixed hyperlipidemia: Secondary | ICD-10-CM | POA: Diagnosis not present

## 2019-08-19 NOTE — Telephone Encounter (Signed)
Abigail Glover (Key: [id number] help? Call us at [phone] Status Sent to [employer] The plan will fax you a determination, typically within 1 to 5 business days.  How do I follow up? Drug Gabapentin 600MG  tablets Form [employer] Medicare Part D Tier Exception Form Tier Exception (Cost-Share Reduction) Request [phone] [phone]

## 2019-08-19 NOTE — Telephone Encounter (Signed)
Per fax from [place]- Tier change request approved for the Gabapentin. Approval coverage from 07/04/19- 07/02/20 with Holland Falling

## 2019-08-19 NOTE — Telephone Encounter (Signed)
Will start working on this. Thanks!

## 2019-08-19 NOTE — Telephone Encounter (Signed)
Please do a PA for this patient per scanned card from 2019

## 2019-10-29 DIAGNOSIS — D631 Anemia in chronic kidney disease: Secondary | ICD-10-CM | POA: Diagnosis not present

## 2019-10-29 DIAGNOSIS — E872 Acidosis: Secondary | ICD-10-CM | POA: Diagnosis not present

## 2019-10-29 DIAGNOSIS — I129 Hypertensive chronic kidney disease with stage 1 through stage 4 chronic kidney disease, or unspecified chronic kidney disease: Secondary | ICD-10-CM | POA: Diagnosis not present

## 2019-10-29 DIAGNOSIS — N1832 Chronic kidney disease, stage 3b: Secondary | ICD-10-CM | POA: Diagnosis not present

## 2019-11-01 DIAGNOSIS — M25511 Pain in right shoulder: Secondary | ICD-10-CM | POA: Diagnosis not present

## 2019-11-01 DIAGNOSIS — S29011A Strain of muscle and tendon of front wall of thorax, initial encounter: Secondary | ICD-10-CM | POA: Diagnosis not present

## 2019-11-01 DIAGNOSIS — M25512 Pain in left shoulder: Secondary | ICD-10-CM | POA: Diagnosis not present

## 2019-11-04 DIAGNOSIS — N2581 Secondary hyperparathyroidism of renal origin: Secondary | ICD-10-CM | POA: Diagnosis not present

## 2019-11-04 DIAGNOSIS — I129 Hypertensive chronic kidney disease with stage 1 through stage 4 chronic kidney disease, or unspecified chronic kidney disease: Secondary | ICD-10-CM | POA: Diagnosis not present

## 2019-11-04 DIAGNOSIS — D631 Anemia in chronic kidney disease: Secondary | ICD-10-CM | POA: Diagnosis not present

## 2019-11-04 DIAGNOSIS — N1832 Chronic kidney disease, stage 3b: Secondary | ICD-10-CM | POA: Diagnosis not present

## 2019-11-04 DIAGNOSIS — E872 Acidosis: Secondary | ICD-10-CM | POA: Diagnosis not present

## 2019-11-12 DIAGNOSIS — R69 Illness, unspecified: Secondary | ICD-10-CM | POA: Diagnosis not present

## 2019-11-18 DIAGNOSIS — I10 Essential (primary) hypertension: Secondary | ICD-10-CM | POA: Diagnosis not present

## 2019-11-18 DIAGNOSIS — G300 Alzheimer's disease with early onset: Secondary | ICD-10-CM | POA: Diagnosis not present

## 2019-11-18 DIAGNOSIS — Z6834 Body mass index (BMI) 34.0-34.9, adult: Secondary | ICD-10-CM | POA: Diagnosis not present

## 2019-11-18 DIAGNOSIS — E782 Mixed hyperlipidemia: Secondary | ICD-10-CM | POA: Diagnosis not present

## 2019-11-18 DIAGNOSIS — M722 Plantar fascial fibromatosis: Secondary | ICD-10-CM | POA: Diagnosis not present

## 2019-11-18 DIAGNOSIS — Z20822 Contact with and (suspected) exposure to covid-19: Secondary | ICD-10-CM | POA: Diagnosis not present

## 2019-11-18 DIAGNOSIS — G4700 Insomnia, unspecified: Secondary | ICD-10-CM | POA: Diagnosis not present

## 2019-11-18 DIAGNOSIS — N1831 Chronic kidney disease, stage 3a: Secondary | ICD-10-CM | POA: Diagnosis not present

## 2019-11-18 DIAGNOSIS — Z79899 Other long term (current) drug therapy: Secondary | ICD-10-CM | POA: Diagnosis not present

## 2019-11-18 DIAGNOSIS — K219 Gastro-esophageal reflux disease without esophagitis: Secondary | ICD-10-CM | POA: Diagnosis not present

## 2019-11-18 DIAGNOSIS — E669 Obesity, unspecified: Secondary | ICD-10-CM | POA: Diagnosis not present

## 2020-01-22 DIAGNOSIS — R69 Illness, unspecified: Secondary | ICD-10-CM | POA: Diagnosis not present

## 2020-02-20 DIAGNOSIS — M722 Plantar fascial fibromatosis: Secondary | ICD-10-CM | POA: Diagnosis not present

## 2020-02-20 DIAGNOSIS — E782 Mixed hyperlipidemia: Secondary | ICD-10-CM | POA: Diagnosis not present

## 2020-02-20 DIAGNOSIS — Z9181 History of falling: Secondary | ICD-10-CM | POA: Diagnosis not present

## 2020-02-20 DIAGNOSIS — Z79899 Other long term (current) drug therapy: Secondary | ICD-10-CM | POA: Diagnosis not present

## 2020-02-20 DIAGNOSIS — K219 Gastro-esophageal reflux disease without esophagitis: Secondary | ICD-10-CM | POA: Diagnosis not present

## 2020-02-20 DIAGNOSIS — I10 Essential (primary) hypertension: Secondary | ICD-10-CM | POA: Diagnosis not present

## 2020-02-20 DIAGNOSIS — G500 Trigeminal neuralgia: Secondary | ICD-10-CM | POA: Diagnosis not present

## 2020-02-20 DIAGNOSIS — Z6835 Body mass index (BMI) 35.0-35.9, adult: Secondary | ICD-10-CM | POA: Diagnosis not present

## 2020-02-20 DIAGNOSIS — N1831 Chronic kidney disease, stage 3a: Secondary | ICD-10-CM | POA: Diagnosis not present

## 2020-03-14 DIAGNOSIS — M79606 Pain in leg, unspecified: Secondary | ICD-10-CM | POA: Diagnosis not present

## 2020-03-18 ENCOUNTER — Ambulatory Visit: Payer: Medicare HMO | Admitting: Neurology

## 2020-03-22 DIAGNOSIS — J188 Other pneumonia, unspecified organism: Secondary | ICD-10-CM | POA: Diagnosis not present

## 2020-03-22 DIAGNOSIS — Z1152 Encounter for screening for COVID-19: Secondary | ICD-10-CM | POA: Diagnosis not present

## 2020-03-28 DIAGNOSIS — J069 Acute upper respiratory infection, unspecified: Secondary | ICD-10-CM | POA: Diagnosis not present

## 2020-03-30 DIAGNOSIS — Z79899 Other long term (current) drug therapy: Secondary | ICD-10-CM | POA: Diagnosis not present

## 2020-03-30 DIAGNOSIS — I10 Essential (primary) hypertension: Secondary | ICD-10-CM | POA: Diagnosis not present

## 2020-03-30 DIAGNOSIS — H6122 Impacted cerumen, left ear: Secondary | ICD-10-CM | POA: Diagnosis not present

## 2020-03-30 DIAGNOSIS — J189 Pneumonia, unspecified organism: Secondary | ICD-10-CM | POA: Diagnosis not present

## 2020-04-04 DIAGNOSIS — Z1152 Encounter for screening for COVID-19: Secondary | ICD-10-CM | POA: Diagnosis not present

## 2020-04-04 DIAGNOSIS — R519 Headache, unspecified: Secondary | ICD-10-CM | POA: Diagnosis not present

## 2020-04-11 DIAGNOSIS — R519 Headache, unspecified: Secondary | ICD-10-CM | POA: Diagnosis not present

## 2020-04-11 DIAGNOSIS — Z20828 Contact with and (suspected) exposure to other viral communicable diseases: Secondary | ICD-10-CM | POA: Diagnosis not present

## 2020-04-29 DIAGNOSIS — M7632 Iliotibial band syndrome, left leg: Secondary | ICD-10-CM | POA: Diagnosis not present

## 2020-04-29 DIAGNOSIS — N8111 Cystocele, midline: Secondary | ICD-10-CM | POA: Diagnosis not present

## 2020-04-29 DIAGNOSIS — Z6834 Body mass index (BMI) 34.0-34.9, adult: Secondary | ICD-10-CM | POA: Diagnosis not present

## 2020-05-06 DIAGNOSIS — D631 Anemia in chronic kidney disease: Secondary | ICD-10-CM | POA: Diagnosis not present

## 2020-05-06 DIAGNOSIS — N1832 Chronic kidney disease, stage 3b: Secondary | ICD-10-CM | POA: Diagnosis not present

## 2020-05-06 DIAGNOSIS — N2581 Secondary hyperparathyroidism of renal origin: Secondary | ICD-10-CM | POA: Diagnosis not present

## 2020-05-06 DIAGNOSIS — I129 Hypertensive chronic kidney disease with stage 1 through stage 4 chronic kidney disease, or unspecified chronic kidney disease: Secondary | ICD-10-CM | POA: Diagnosis not present

## 2020-05-06 DIAGNOSIS — E872 Acidosis: Secondary | ICD-10-CM | POA: Diagnosis not present

## 2020-05-11 DIAGNOSIS — D631 Anemia in chronic kidney disease: Secondary | ICD-10-CM | POA: Diagnosis not present

## 2020-05-11 DIAGNOSIS — E872 Acidosis: Secondary | ICD-10-CM | POA: Diagnosis not present

## 2020-05-11 DIAGNOSIS — N2581 Secondary hyperparathyroidism of renal origin: Secondary | ICD-10-CM | POA: Diagnosis not present

## 2020-05-11 DIAGNOSIS — N1832 Chronic kidney disease, stage 3b: Secondary | ICD-10-CM | POA: Diagnosis not present

## 2020-05-11 DIAGNOSIS — I129 Hypertensive chronic kidney disease with stage 1 through stage 4 chronic kidney disease, or unspecified chronic kidney disease: Secondary | ICD-10-CM | POA: Diagnosis not present

## 2020-05-18 DIAGNOSIS — R69 Illness, unspecified: Secondary | ICD-10-CM | POA: Diagnosis not present

## 2020-06-02 DIAGNOSIS — N889 Noninflammatory disorder of cervix uteri, unspecified: Secondary | ICD-10-CM | POA: Diagnosis not present

## 2020-06-02 DIAGNOSIS — Z01419 Encounter for gynecological examination (general) (routine) without abnormal findings: Secondary | ICD-10-CM | POA: Diagnosis not present

## 2020-06-02 DIAGNOSIS — N841 Polyp of cervix uteri: Secondary | ICD-10-CM | POA: Diagnosis not present

## 2020-06-02 DIAGNOSIS — N8110 Cystocele, unspecified: Secondary | ICD-10-CM | POA: Diagnosis not present

## 2020-06-02 DIAGNOSIS — R32 Unspecified urinary incontinence: Secondary | ICD-10-CM | POA: Diagnosis not present

## 2020-06-10 DIAGNOSIS — E782 Mixed hyperlipidemia: Secondary | ICD-10-CM | POA: Diagnosis not present

## 2020-06-10 DIAGNOSIS — N1831 Chronic kidney disease, stage 3a: Secondary | ICD-10-CM | POA: Diagnosis not present

## 2020-06-10 DIAGNOSIS — G4700 Insomnia, unspecified: Secondary | ICD-10-CM | POA: Diagnosis not present

## 2020-06-10 DIAGNOSIS — K219 Gastro-esophageal reflux disease without esophagitis: Secondary | ICD-10-CM | POA: Diagnosis not present

## 2020-06-10 DIAGNOSIS — I10 Essential (primary) hypertension: Secondary | ICD-10-CM | POA: Diagnosis not present

## 2020-06-10 DIAGNOSIS — G500 Trigeminal neuralgia: Secondary | ICD-10-CM | POA: Diagnosis not present

## 2020-06-10 DIAGNOSIS — Z6835 Body mass index (BMI) 35.0-35.9, adult: Secondary | ICD-10-CM | POA: Diagnosis not present

## 2020-06-10 DIAGNOSIS — Z79899 Other long term (current) drug therapy: Secondary | ICD-10-CM | POA: Diagnosis not present

## 2020-06-10 DIAGNOSIS — Z139 Encounter for screening, unspecified: Secondary | ICD-10-CM | POA: Diagnosis not present

## 2020-06-10 DIAGNOSIS — M722 Plantar fascial fibromatosis: Secondary | ICD-10-CM | POA: Diagnosis not present

## 2020-06-11 ENCOUNTER — Ambulatory Visit: Payer: Medicare HMO | Admitting: Neurology

## 2020-06-17 DIAGNOSIS — N8110 Cystocele, unspecified: Secondary | ICD-10-CM | POA: Diagnosis not present

## 2020-06-17 DIAGNOSIS — N889 Noninflammatory disorder of cervix uteri, unspecified: Secondary | ICD-10-CM | POA: Diagnosis not present

## 2020-07-08 DIAGNOSIS — N8110 Cystocele, unspecified: Secondary | ICD-10-CM | POA: Diagnosis not present

## 2020-07-08 DIAGNOSIS — R32 Unspecified urinary incontinence: Secondary | ICD-10-CM | POA: Diagnosis not present

## 2020-07-25 DIAGNOSIS — R509 Fever, unspecified: Secondary | ICD-10-CM | POA: Diagnosis not present

## 2020-07-25 DIAGNOSIS — R059 Cough, unspecified: Secondary | ICD-10-CM | POA: Diagnosis not present

## 2020-07-25 DIAGNOSIS — Z20828 Contact with and (suspected) exposure to other viral communicable diseases: Secondary | ICD-10-CM | POA: Diagnosis not present

## 2020-07-25 DIAGNOSIS — R0981 Nasal congestion: Secondary | ICD-10-CM | POA: Diagnosis not present

## 2020-07-25 DIAGNOSIS — R051 Acute cough: Secondary | ICD-10-CM | POA: Diagnosis not present

## 2020-08-06 DIAGNOSIS — R053 Chronic cough: Secondary | ICD-10-CM | POA: Diagnosis not present

## 2020-08-12 DIAGNOSIS — Z6832 Body mass index (BMI) 32.0-32.9, adult: Secondary | ICD-10-CM | POA: Diagnosis not present

## 2020-08-12 DIAGNOSIS — U071 COVID-19: Secondary | ICD-10-CM | POA: Diagnosis not present

## 2020-08-12 DIAGNOSIS — R69 Illness, unspecified: Secondary | ICD-10-CM | POA: Diagnosis not present

## 2020-08-12 DIAGNOSIS — J1282 Pneumonia due to coronavirus disease 2019: Secondary | ICD-10-CM | POA: Diagnosis not present

## 2020-08-23 ENCOUNTER — Ambulatory Visit: Payer: Medicare HMO | Admitting: Neurology

## 2020-08-23 DIAGNOSIS — S76912A Strain of unspecified muscles, fascia and tendons at thigh level, left thigh, initial encounter: Secondary | ICD-10-CM | POA: Diagnosis not present

## 2020-09-02 DIAGNOSIS — N8110 Cystocele, unspecified: Secondary | ICD-10-CM | POA: Diagnosis not present

## 2020-09-02 DIAGNOSIS — N889 Noninflammatory disorder of cervix uteri, unspecified: Secondary | ICD-10-CM | POA: Diagnosis not present

## 2020-09-10 DIAGNOSIS — E782 Mixed hyperlipidemia: Secondary | ICD-10-CM | POA: Diagnosis not present

## 2020-09-10 DIAGNOSIS — M722 Plantar fascial fibromatosis: Secondary | ICD-10-CM | POA: Diagnosis not present

## 2020-09-10 DIAGNOSIS — Z79891 Long term (current) use of opiate analgesic: Secondary | ICD-10-CM | POA: Diagnosis not present

## 2020-09-10 DIAGNOSIS — G500 Trigeminal neuralgia: Secondary | ICD-10-CM | POA: Diagnosis not present

## 2020-09-10 DIAGNOSIS — K589 Irritable bowel syndrome without diarrhea: Secondary | ICD-10-CM | POA: Diagnosis not present

## 2020-09-10 DIAGNOSIS — G4700 Insomnia, unspecified: Secondary | ICD-10-CM | POA: Diagnosis not present

## 2020-09-10 DIAGNOSIS — Z79899 Other long term (current) drug therapy: Secondary | ICD-10-CM | POA: Diagnosis not present

## 2020-09-10 DIAGNOSIS — N1831 Chronic kidney disease, stage 3a: Secondary | ICD-10-CM | POA: Diagnosis not present

## 2020-09-10 DIAGNOSIS — K219 Gastro-esophageal reflux disease without esophagitis: Secondary | ICD-10-CM | POA: Diagnosis not present

## 2020-09-10 DIAGNOSIS — I10 Essential (primary) hypertension: Secondary | ICD-10-CM | POA: Diagnosis not present

## 2020-09-17 ENCOUNTER — Ambulatory Visit: Payer: Medicare HMO | Admitting: Neurology

## 2020-09-17 DIAGNOSIS — N8110 Cystocele, unspecified: Secondary | ICD-10-CM | POA: Diagnosis not present

## 2020-09-24 DIAGNOSIS — K589 Irritable bowel syndrome without diarrhea: Secondary | ICD-10-CM | POA: Diagnosis not present

## 2020-09-24 DIAGNOSIS — M722 Plantar fascial fibromatosis: Secondary | ICD-10-CM | POA: Diagnosis not present

## 2020-09-24 DIAGNOSIS — N1831 Chronic kidney disease, stage 3a: Secondary | ICD-10-CM | POA: Diagnosis not present

## 2020-09-24 DIAGNOSIS — G4700 Insomnia, unspecified: Secondary | ICD-10-CM | POA: Diagnosis not present

## 2020-09-24 DIAGNOSIS — K219 Gastro-esophageal reflux disease without esophagitis: Secondary | ICD-10-CM | POA: Diagnosis not present

## 2020-09-24 DIAGNOSIS — Z79899 Other long term (current) drug therapy: Secondary | ICD-10-CM | POA: Diagnosis not present

## 2020-09-24 DIAGNOSIS — E782 Mixed hyperlipidemia: Secondary | ICD-10-CM | POA: Diagnosis not present

## 2020-09-24 DIAGNOSIS — Z6834 Body mass index (BMI) 34.0-34.9, adult: Secondary | ICD-10-CM | POA: Diagnosis not present

## 2020-09-24 DIAGNOSIS — G500 Trigeminal neuralgia: Secondary | ICD-10-CM | POA: Diagnosis not present

## 2020-09-24 DIAGNOSIS — I10 Essential (primary) hypertension: Secondary | ICD-10-CM | POA: Diagnosis not present

## 2020-09-28 ENCOUNTER — Encounter: Payer: Self-pay | Admitting: Neurology

## 2020-09-28 NOTE — Progress Notes (Signed)
Gabapentin is a covered drug per insurance.  Approval valid from 07/03/20 to 07/02/21 for the Tier Exception for the Gabapentin medication.   Both forms sent to scanning for chart.

## 2020-11-04 DIAGNOSIS — N8110 Cystocele, unspecified: Secondary | ICD-10-CM | POA: Diagnosis not present

## 2020-11-09 DIAGNOSIS — Z1389 Encounter for screening for other disorder: Secondary | ICD-10-CM | POA: Diagnosis not present

## 2020-11-09 DIAGNOSIS — Z79899 Other long term (current) drug therapy: Secondary | ICD-10-CM | POA: Diagnosis not present

## 2020-11-09 DIAGNOSIS — G894 Chronic pain syndrome: Secondary | ICD-10-CM | POA: Diagnosis not present

## 2020-11-09 DIAGNOSIS — G500 Trigeminal neuralgia: Secondary | ICD-10-CM | POA: Diagnosis not present

## 2020-11-09 DIAGNOSIS — M722 Plantar fascial fibromatosis: Secondary | ICD-10-CM | POA: Diagnosis not present

## 2020-11-10 DIAGNOSIS — I129 Hypertensive chronic kidney disease with stage 1 through stage 4 chronic kidney disease, or unspecified chronic kidney disease: Secondary | ICD-10-CM | POA: Diagnosis not present

## 2020-11-10 DIAGNOSIS — E872 Acidosis: Secondary | ICD-10-CM | POA: Diagnosis not present

## 2020-11-10 DIAGNOSIS — D631 Anemia in chronic kidney disease: Secondary | ICD-10-CM | POA: Diagnosis not present

## 2020-11-10 DIAGNOSIS — N2581 Secondary hyperparathyroidism of renal origin: Secondary | ICD-10-CM | POA: Diagnosis not present

## 2020-11-10 DIAGNOSIS — N1832 Chronic kidney disease, stage 3b: Secondary | ICD-10-CM | POA: Diagnosis not present

## 2020-11-16 DIAGNOSIS — N1832 Chronic kidney disease, stage 3b: Secondary | ICD-10-CM | POA: Diagnosis not present

## 2020-11-16 DIAGNOSIS — E872 Acidosis: Secondary | ICD-10-CM | POA: Diagnosis not present

## 2020-11-16 DIAGNOSIS — D631 Anemia in chronic kidney disease: Secondary | ICD-10-CM | POA: Diagnosis not present

## 2020-11-16 DIAGNOSIS — N2581 Secondary hyperparathyroidism of renal origin: Secondary | ICD-10-CM | POA: Diagnosis not present

## 2020-11-16 DIAGNOSIS — I129 Hypertensive chronic kidney disease with stage 1 through stage 4 chronic kidney disease, or unspecified chronic kidney disease: Secondary | ICD-10-CM | POA: Diagnosis not present

## 2020-11-22 DIAGNOSIS — Z1389 Encounter for screening for other disorder: Secondary | ICD-10-CM | POA: Diagnosis not present

## 2020-11-22 DIAGNOSIS — M722 Plantar fascial fibromatosis: Secondary | ICD-10-CM | POA: Diagnosis not present

## 2020-11-22 DIAGNOSIS — G500 Trigeminal neuralgia: Secondary | ICD-10-CM | POA: Diagnosis not present

## 2020-11-22 DIAGNOSIS — G894 Chronic pain syndrome: Secondary | ICD-10-CM | POA: Diagnosis not present

## 2020-11-25 DIAGNOSIS — Z6836 Body mass index (BMI) 36.0-36.9, adult: Secondary | ICD-10-CM | POA: Diagnosis not present

## 2020-11-25 DIAGNOSIS — I10 Essential (primary) hypertension: Secondary | ICD-10-CM | POA: Diagnosis not present

## 2020-11-25 DIAGNOSIS — R69 Illness, unspecified: Secondary | ICD-10-CM | POA: Diagnosis not present

## 2020-11-26 ENCOUNTER — Ambulatory Visit: Payer: Medicare HMO | Admitting: Neurology

## 2020-12-03 ENCOUNTER — Other Ambulatory Visit: Payer: Self-pay

## 2020-12-03 ENCOUNTER — Ambulatory Visit: Payer: Medicare HMO | Admitting: Neurology

## 2020-12-03 ENCOUNTER — Encounter: Payer: Self-pay | Admitting: Neurology

## 2020-12-03 VITALS — BP 156/77 | HR 89 | Ht 60.0 in | Wt 192.0 lb

## 2020-12-03 DIAGNOSIS — G500 Trigeminal neuralgia: Secondary | ICD-10-CM

## 2020-12-03 MED ORDER — GABAPENTIN 600 MG PO TABS: ORAL_TABLET | ORAL | 3 refills | Status: DC

## 2020-12-03 NOTE — Progress Notes (Signed)
    Follow-up Visit   Date: 12/03/20    Abigail Glover MRN: [id number] DOB: [birth year]   Interim History: Abigail Glover is a 75 y.o. left-handed Caucasian female with hypertension returning to the clinic for follow-up of left trigeminal neuralgia.  The patient was accompanied to the clinic by self.  Overall, her left facial pain has been in remission.  She has been able to reduce gabapentin to 600mg  in the morning. She was previously on gabapenin 900mg  BID.  She has been off carbamazepine for the past 6 months and is doing well.  She is also seeing pain management for chronic foot pain.    Medications:  Current Outpatient Medications on File Prior to Visit  Medication Sig Dispense Refill  . dicyclomine (BENTYL) 10 MG capsule Take 10 mg by mouth 3 (three) times daily.    Marland Kitchen gabapentin (NEURONTIN) 600 MG tablet TAKE 1.5 TABLETS BY MOUTH TWICE DAILY  (900 MG) 270 tablet 3  . hydrALAZINE (APRESOLINE) 25 MG tablet     . hydrochlorothiazide (HYDRODIURIL) 25 MG tablet Take 25 mg by mouth daily.    Marland Kitchen HYDROcodone-acetaminophen (NORCO/VICODIN) 5-325 MG tablet     . losartan (COZAAR) 100 MG tablet     . pantoprazole (PROTONIX) 40 MG tablet Take 40 mg by mouth daily.    . rosuvastatin (CRESTOR) 5 MG tablet rosuvastatin 5 mg tablet    . zolpidem (AMBIEN) 10 MG tablet zolpidem 10 mg tablet  TAKE 1 TABLET BY MOUTH AT BEDTIME    . carbamazepine (TEGRETOL) 200 MG tablet Take 1 tablet (200 mg total) by mouth 2 (two) times daily. (Patient not taking: Reported on 12/03/2020) 180 tablet 3   No current facility-administered medications on file prior to visit.    Allergies:  Allergies  Allergen Reactions  . Amitriptyline   . Cephalexin   . Penicillin G   . Prednisone   . Seasonal Ic [Cholestatin]     Vital Signs:  BP (!) 156/77   Pulse 89   Ht 5' (1.524 m)   Wt 192 lb (87.1 kg)   SpO2 93%   BMI 37.50 kg/m    Neurological Exam: MENTAL STATUS including orientation to time, place,  person, recent and remote memory, attention span and concentration, language, and fund of knowledge is normal.  Speech is not dysarthric.  CRANIAL NERVES:  Pupils equal round and reactive to light.  Normal conjugate, extra-ocular eye movements in all directions of gaze. Mild left ptosis (old). Face is symmetric. Facial sensation intact. Palate elevates symmetrically.  Tongue is midline.  MOTOR:  Motor strength is 5/5 in all extremities.  No pronator drift.  Tone is normal.    COORDINATION/GAIT:    Gait narrow based and stable.   IMPRESSION/PLAN: Left trigeminal neuralgia, diagnosed 2014, in remission.  - Reduce gabapentin to 300-600mg  daily  - OK to stay off carbamazepine, she was previously on 200mg  twice daily  Return to clinic in 1 year   Thank you for allowing me to participate in patient's care.  If I can answer any additional questions, I would be pleased to do so.    Sincerely,    Anecia Nusbaum K. Posey Pronto, DO

## 2020-12-16 DIAGNOSIS — G894 Chronic pain syndrome: Secondary | ICD-10-CM | POA: Diagnosis not present

## 2020-12-16 DIAGNOSIS — Z1389 Encounter for screening for other disorder: Secondary | ICD-10-CM | POA: Diagnosis not present

## 2020-12-16 DIAGNOSIS — M722 Plantar fascial fibromatosis: Secondary | ICD-10-CM | POA: Diagnosis not present

## 2020-12-16 DIAGNOSIS — Z79899 Other long term (current) drug therapy: Secondary | ICD-10-CM | POA: Diagnosis not present

## 2020-12-16 DIAGNOSIS — M1990 Unspecified osteoarthritis, unspecified site: Secondary | ICD-10-CM | POA: Diagnosis not present

## 2020-12-16 DIAGNOSIS — G500 Trigeminal neuralgia: Secondary | ICD-10-CM | POA: Diagnosis not present

## 2020-12-21 DIAGNOSIS — N8110 Cystocele, unspecified: Secondary | ICD-10-CM | POA: Diagnosis not present

## 2020-12-23 DIAGNOSIS — Z6837 Body mass index (BMI) 37.0-37.9, adult: Secondary | ICD-10-CM | POA: Diagnosis not present

## 2020-12-23 DIAGNOSIS — B079 Viral wart, unspecified: Secondary | ICD-10-CM | POA: Diagnosis not present

## 2020-12-23 DIAGNOSIS — K120 Recurrent oral aphthae: Secondary | ICD-10-CM | POA: Diagnosis not present

## 2020-12-23 DIAGNOSIS — I10 Essential (primary) hypertension: Secondary | ICD-10-CM | POA: Diagnosis not present

## 2021-01-06 DIAGNOSIS — Z6837 Body mass index (BMI) 37.0-37.9, adult: Secondary | ICD-10-CM | POA: Diagnosis not present

## 2021-01-06 DIAGNOSIS — L570 Actinic keratosis: Secondary | ICD-10-CM | POA: Diagnosis not present

## 2021-01-06 DIAGNOSIS — I10 Essential (primary) hypertension: Secondary | ICD-10-CM | POA: Diagnosis not present

## 2021-01-13 DIAGNOSIS — Z6837 Body mass index (BMI) 37.0-37.9, adult: Secondary | ICD-10-CM | POA: Diagnosis not present

## 2021-01-13 DIAGNOSIS — I10 Essential (primary) hypertension: Secondary | ICD-10-CM | POA: Diagnosis not present

## 2021-01-13 DIAGNOSIS — L570 Actinic keratosis: Secondary | ICD-10-CM | POA: Diagnosis not present

## 2021-01-18 DIAGNOSIS — M1990 Unspecified osteoarthritis, unspecified site: Secondary | ICD-10-CM | POA: Diagnosis not present

## 2021-01-18 DIAGNOSIS — Z79899 Other long term (current) drug therapy: Secondary | ICD-10-CM | POA: Diagnosis not present

## 2021-01-18 DIAGNOSIS — M722 Plantar fascial fibromatosis: Secondary | ICD-10-CM | POA: Diagnosis not present

## 2021-01-18 DIAGNOSIS — G894 Chronic pain syndrome: Secondary | ICD-10-CM | POA: Diagnosis not present

## 2021-01-18 DIAGNOSIS — G500 Trigeminal neuralgia: Secondary | ICD-10-CM | POA: Diagnosis not present

## 2021-01-18 DIAGNOSIS — Z1389 Encounter for screening for other disorder: Secondary | ICD-10-CM | POA: Diagnosis not present

## 2021-02-08 DIAGNOSIS — B079 Viral wart, unspecified: Secondary | ICD-10-CM | POA: Diagnosis not present

## 2021-02-08 DIAGNOSIS — Z6838 Body mass index (BMI) 38.0-38.9, adult: Secondary | ICD-10-CM | POA: Diagnosis not present

## 2021-02-15 DIAGNOSIS — G500 Trigeminal neuralgia: Secondary | ICD-10-CM | POA: Diagnosis not present

## 2021-02-15 DIAGNOSIS — M1990 Unspecified osteoarthritis, unspecified site: Secondary | ICD-10-CM | POA: Diagnosis not present

## 2021-02-15 DIAGNOSIS — M722 Plantar fascial fibromatosis: Secondary | ICD-10-CM | POA: Diagnosis not present

## 2021-02-15 DIAGNOSIS — Z79899 Other long term (current) drug therapy: Secondary | ICD-10-CM | POA: Diagnosis not present

## 2021-02-15 DIAGNOSIS — G894 Chronic pain syndrome: Secondary | ICD-10-CM | POA: Diagnosis not present

## 2021-02-15 DIAGNOSIS — Z1389 Encounter for screening for other disorder: Secondary | ICD-10-CM | POA: Diagnosis not present

## 2021-02-16 DIAGNOSIS — N8110 Cystocele, unspecified: Secondary | ICD-10-CM | POA: Diagnosis not present

## 2021-03-16 DIAGNOSIS — Z79899 Other long term (current) drug therapy: Secondary | ICD-10-CM | POA: Diagnosis not present

## 2021-03-16 DIAGNOSIS — M722 Plantar fascial fibromatosis: Secondary | ICD-10-CM | POA: Diagnosis not present

## 2021-03-16 DIAGNOSIS — G894 Chronic pain syndrome: Secondary | ICD-10-CM | POA: Diagnosis not present

## 2021-03-16 DIAGNOSIS — G500 Trigeminal neuralgia: Secondary | ICD-10-CM | POA: Diagnosis not present

## 2021-03-16 DIAGNOSIS — Z1389 Encounter for screening for other disorder: Secondary | ICD-10-CM | POA: Diagnosis not present

## 2021-03-16 DIAGNOSIS — M1990 Unspecified osteoarthritis, unspecified site: Secondary | ICD-10-CM | POA: Diagnosis not present

## 2021-03-17 DIAGNOSIS — N938 Other specified abnormal uterine and vaginal bleeding: Secondary | ICD-10-CM | POA: Diagnosis not present

## 2021-03-17 DIAGNOSIS — Z4689 Encounter for fitting and adjustment of other specified devices: Secondary | ICD-10-CM | POA: Diagnosis not present

## 2021-03-17 DIAGNOSIS — N760 Acute vaginitis: Secondary | ICD-10-CM | POA: Diagnosis not present

## 2021-03-22 DIAGNOSIS — M1990 Unspecified osteoarthritis, unspecified site: Secondary | ICD-10-CM | POA: Diagnosis not present

## 2021-03-22 DIAGNOSIS — M7989 Other specified soft tissue disorders: Secondary | ICD-10-CM | POA: Diagnosis not present

## 2021-04-13 DIAGNOSIS — Z1389 Encounter for screening for other disorder: Secondary | ICD-10-CM | POA: Diagnosis not present

## 2021-04-13 DIAGNOSIS — M1990 Unspecified osteoarthritis, unspecified site: Secondary | ICD-10-CM | POA: Diagnosis not present

## 2021-04-13 DIAGNOSIS — N939 Abnormal uterine and vaginal bleeding, unspecified: Secondary | ICD-10-CM | POA: Diagnosis not present

## 2021-04-13 DIAGNOSIS — G894 Chronic pain syndrome: Secondary | ICD-10-CM | POA: Diagnosis not present

## 2021-04-13 DIAGNOSIS — G500 Trigeminal neuralgia: Secondary | ICD-10-CM | POA: Diagnosis not present

## 2021-04-13 DIAGNOSIS — M722 Plantar fascial fibromatosis: Secondary | ICD-10-CM | POA: Diagnosis not present

## 2021-04-13 DIAGNOSIS — N8110 Cystocele, unspecified: Secondary | ICD-10-CM | POA: Diagnosis not present

## 2021-04-13 DIAGNOSIS — Z79891 Long term (current) use of opiate analgesic: Secondary | ICD-10-CM | POA: Diagnosis not present

## 2021-05-03 DIAGNOSIS — N950 Postmenopausal bleeding: Secondary | ICD-10-CM | POA: Diagnosis not present

## 2021-05-03 DIAGNOSIS — R1909 Other intra-abdominal and pelvic swelling, mass and lump: Secondary | ICD-10-CM | POA: Diagnosis not present

## 2021-05-03 DIAGNOSIS — N840 Polyp of corpus uteri: Secondary | ICD-10-CM | POA: Diagnosis not present

## 2021-05-04 ENCOUNTER — Telehealth: Payer: Self-pay | Admitting: *Deleted

## 2021-05-04 NOTE — Telephone Encounter (Signed)
Called and left the patient a message to call the office back. Patient needs to be scheduled for a new patient appt  °

## 2021-05-05 NOTE — Telephone Encounter (Signed)
Patient called the office and requested an afternoon appt. Explained that the office would have to speak with Dr Berline Lopes and call her back

## 2021-05-10 ENCOUNTER — Telehealth: Payer: Self-pay | Admitting: *Deleted

## 2021-05-10 NOTE — Telephone Encounter (Signed)
Spoke with the patient and scheduled a new patient appt for 11/23 at 12:15 pm. Patient given the address and phone number for the clinic; along with the policy for mask and visitors

## 2021-05-12 DIAGNOSIS — Z79891 Long term (current) use of opiate analgesic: Secondary | ICD-10-CM | POA: Diagnosis not present

## 2021-05-12 DIAGNOSIS — G500 Trigeminal neuralgia: Secondary | ICD-10-CM | POA: Diagnosis not present

## 2021-05-12 DIAGNOSIS — M1990 Unspecified osteoarthritis, unspecified site: Secondary | ICD-10-CM | POA: Diagnosis not present

## 2021-05-12 DIAGNOSIS — Z1389 Encounter for screening for other disorder: Secondary | ICD-10-CM | POA: Diagnosis not present

## 2021-05-12 DIAGNOSIS — M722 Plantar fascial fibromatosis: Secondary | ICD-10-CM | POA: Diagnosis not present

## 2021-05-12 DIAGNOSIS — G894 Chronic pain syndrome: Secondary | ICD-10-CM | POA: Diagnosis not present

## 2021-05-23 DIAGNOSIS — I129 Hypertensive chronic kidney disease with stage 1 through stage 4 chronic kidney disease, or unspecified chronic kidney disease: Secondary | ICD-10-CM | POA: Diagnosis not present

## 2021-05-23 DIAGNOSIS — D631 Anemia in chronic kidney disease: Secondary | ICD-10-CM | POA: Diagnosis not present

## 2021-05-23 DIAGNOSIS — N1832 Chronic kidney disease, stage 3b: Secondary | ICD-10-CM | POA: Diagnosis not present

## 2021-05-23 DIAGNOSIS — N2581 Secondary hyperparathyroidism of renal origin: Secondary | ICD-10-CM | POA: Diagnosis not present

## 2021-05-23 DIAGNOSIS — E872 Acidosis, unspecified: Secondary | ICD-10-CM | POA: Diagnosis not present

## 2021-05-25 ENCOUNTER — Other Ambulatory Visit: Payer: Self-pay

## 2021-05-25 ENCOUNTER — Encounter: Payer: Self-pay | Admitting: Gynecologic Oncology

## 2021-05-25 ENCOUNTER — Inpatient Hospital Stay: Payer: Medicare HMO | Attending: Gynecologic Oncology | Admitting: Gynecologic Oncology

## 2021-05-25 ENCOUNTER — Inpatient Hospital Stay (HOSPITAL_BASED_OUTPATIENT_CLINIC_OR_DEPARTMENT_OTHER): Payer: Medicare HMO | Admitting: Gynecologic Oncology

## 2021-05-25 VITALS — BP 140/75 | HR 86 | Temp 98.9°F | Resp 16 | Ht 60.0 in | Wt 198.0 lb

## 2021-05-25 DIAGNOSIS — R1900 Intra-abdominal and pelvic swelling, mass and lump, unspecified site: Secondary | ICD-10-CM | POA: Insufficient documentation

## 2021-05-25 DIAGNOSIS — N838 Other noninflammatory disorders of ovary, fallopian tube and broad ligament: Secondary | ICD-10-CM

## 2021-05-25 DIAGNOSIS — N1832 Chronic kidney disease, stage 3b: Secondary | ICD-10-CM | POA: Diagnosis not present

## 2021-05-25 DIAGNOSIS — Z6838 Body mass index (BMI) 38.0-38.9, adult: Secondary | ICD-10-CM

## 2021-05-25 DIAGNOSIS — G500 Trigeminal neuralgia: Secondary | ICD-10-CM | POA: Insufficient documentation

## 2021-05-25 DIAGNOSIS — R971 Elevated cancer antigen 125 [CA 125]: Secondary | ICD-10-CM | POA: Diagnosis not present

## 2021-05-25 DIAGNOSIS — R978 Other abnormal tumor markers: Secondary | ICD-10-CM

## 2021-05-25 DIAGNOSIS — N840 Polyp of corpus uteri: Secondary | ICD-10-CM | POA: Insufficient documentation

## 2021-05-25 DIAGNOSIS — K589 Irritable bowel syndrome without diarrhea: Secondary | ICD-10-CM | POA: Diagnosis not present

## 2021-05-25 DIAGNOSIS — Z79899 Other long term (current) drug therapy: Secondary | ICD-10-CM | POA: Insufficient documentation

## 2021-05-25 DIAGNOSIS — I129 Hypertensive chronic kidney disease with stage 1 through stage 4 chronic kidney disease, or unspecified chronic kidney disease: Secondary | ICD-10-CM | POA: Insufficient documentation

## 2021-05-25 MED ORDER — SENNOSIDES-DOCUSATE SODIUM 8.6-50 MG PO TABS: 2.0000 | ORAL_TABLET | Freq: Every day | ORAL | 0 refills | Status: DC

## 2021-05-25 NOTE — Patient Instructions (Signed)
Preparing for your Surgery   Plan for surgery on June 14, 2021 with Dr. Jeral Pinch at [facility] will be scheduled for robotic assisted total laparoscopic hysterectomy (removal of the uterus and cervix), bilateral salpingo-oophorectomy (removal of both ovaries and fallopian tubes), possible staging if a cancer is identified, possible laparotomy (larger incision on your abdomen if needed).    Pre-operative Testing -You will receive a phone call from presurgical testing at [facility] to arrange for a pre-operative appointment and lab work.   -Bring your insurance card, copy of an advanced directive if applicable, medication list   -At that visit, you will be asked to sign a consent for a possible blood transfusion in case a transfusion becomes necessary during surgery.  The need for a blood transfusion is rare but having consent is a necessary part of your care.      -You should not be taking blood thinners or aspirin at least ten days prior to surgery unless instructed by your surgeon.   -Do not take supplements such as fish oil (omega 3), red yeast rice, turmeric before your surgery. You want to avoid medications with aspirin in them including headache powders such as BC or Goody's), Excedrin migraine.   Day Before Surgery at [facility] will be asked to take in a light diet the day before surgery. You will be advised you can have clear liquids up until 3 hours before your surgery.     Eat a light diet the day before surgery.  Examples including soups, broths, toast, yogurt, mashed potatoes.  AVOID GAS PRODUCING FOODS. Things to avoid include carbonated beverages (fizzy beverages, sodas), raw fruits and raw vegetables (uncooked), or beans.    If your bowels are filled with gas, your surgeon will have difficulty visualizing your pelvic organs which increases your surgical risks.   Your role in recovery Your role is to become active as soon as directed by your  doctor, while still giving yourself time to heal.  Rest when you feel tired. You will be asked to do the following in order to speed your recovery:   - Cough and breathe deeply. This helps to clear and expand your lungs and can prevent pneumonia after surgery.  - Page. Do mild physical activity. Walking or moving your legs help your circulation and body functions return to normal. Do not try to get up or walk alone the first time after surgery.   -If you develop swelling on one leg or the other, pain in the back of your leg, redness/warmth in one of your legs, please call the office or go to the Emergency Room to have a doppler to rule out a blood clot. For shortness of breath, chest pain-seek care in the Emergency Room as soon as possible. - Actively manage your pain. Managing your pain lets you move in comfort. We will ask you to rate your pain on a scale of zero to 10. It is your responsibility to tell your doctor or nurse where and how much you hurt so your pain can be treated.   Special Considerations -If you are diabetic, you may be placed on insulin after surgery to have closer control over your blood sugars to promote healing and recovery.  This does not mean that you will be discharged on insulin.  If applicable, your oral antidiabetics will be resumed when you are tolerating a solid diet.   -Your final pathology results from surgery  should be available around one week after surgery and the results will be relayed to you when available.   -Dr. Lahoma Crocker is the surgeon that assists your GYN Oncologist with surgery.  If you end up staying the night, the next day after your surgery you will either see Dr. Berline Lopes or Dr. Lahoma Crocker.   -FMLA forms can be faxed to [phone] and please allow 5-7 business days for completion.   Pain Management After Surgery   -Make sure that you have Tylenol at home to use on a regular basis after surgery for pain  control. You can continue use of your hydrocodone/APAP for pain after surgery is needed. Monitor your Tylenol intake since the hydrocodone/APAP has tylenol in it.   -Review the attached handout on narcotic use and their risks and side effects.    Bowel Regimen -You have been prescribed Sennakot-S to take nightly to prevent constipation especially if you are taking the narcotic pain medication intermittently.  It is important to prevent constipation and drink adequate amounts of liquids. You can stop taking this medication when you are not taking pain medication and you are back on your normal bowel routine.   Risks of Surgery Risks of surgery are low but include bleeding, infection, damage to surrounding structures, re-operation, blood clots, and very rarely death.     Blood Transfusion Information (For the consent to be signed before surgery)   We will be checking your blood type before surgery so in case of emergencies, we will know what type of blood you would need.                                             WHAT IS A BLOOD TRANSFUSION?   A transfusion is the replacement of blood or some of its parts. Blood is made up of multiple cells which provide different functions. Red blood cells carry oxygen and are used for blood loss replacement. White blood cells fight against infection. Platelets control bleeding. Plasma helps clot blood. Other blood products are available for specialized needs, such as hemophilia or other clotting disorders. BEFORE THE TRANSFUSION  Who gives blood for transfusions?  You may be able to donate blood to be used at a later date on yourself (autologous donation). Relatives can be asked to donate blood. This is generally not any safer than if you have received blood from a stranger. The same precautions are taken to ensure safety when a relative's blood is donated. Healthy volunteers who are fully evaluated to make sure their blood is safe. This is blood bank  blood. Transfusion therapy is the safest it has ever been in the practice of medicine. Before blood is taken from a donor, a complete history is taken to make sure that person has no history of diseases nor engages in risky social behavior (examples are intravenous drug use or sexual activity with multiple partners). The donor's travel history is screened to minimize risk of transmitting infections, such as malaria. The donated blood is tested for signs of infectious diseases, such as HIV and hepatitis. The blood is then tested to be sure it is compatible with you in order to minimize the chance of a transfusion reaction. If you or a relative donates blood, this is often done in anticipation of surgery and is not appropriate for emergency situations. It takes many days to process the  donated blood. RISKS AND COMPLICATIONS Although transfusion therapy is very safe and saves many lives, the main dangers of transfusion include:  Getting an infectious disease. Developing a transfusion reaction. This is an allergic reaction to something in the blood you were given. Every precaution is taken to prevent this. The decision to have a blood transfusion has been considered carefully by your caregiver before blood is given. Blood is not given unless the benefits outweigh the risks.   AFTER SURGERY INSTRUCTIONS   Return to work: 4-6 weeks if applicable   If a cancer is identified during surgery, you will need to administer once daily lovenox blood thinner injections for a total of 2 weeks. This is to help prevent blood clots. You would want to give the injection at the same time each day.   Activity: 1. Be up and out of the bed during the day.  Take a nap if needed.  You may walk up steps but be careful and use the hand rail.  Stair climbing will tire you more than you think, you may need to stop part way and rest.    2. No lifting or straining for 6 weeks over 10 pounds. No pushing, pulling, straining for 6  weeks.   3. No driving for 1 week(s) if you were cleared to drive before surgery.  Do not drive if you are taking narcotic pain medicine and make sure that your reaction time has returned.    4. You can shower as soon as the next day after surgery. Shower daily.  Use your regular soap and water (not directly on the incision) and pat your incision(s) dry afterwards; don't rub.  No tub baths or submerging your body in water until cleared by your surgeon. If you have the soap that was given to you by pre-surgical testing that was used before surgery, you do not need to use it afterwards because this can irritate your incisions.    5. No sexual activity and nothing in the vagina for 8 weeks.   6. You may experience a small amount of clear drainage from your incisions, which is normal.  If the drainage persists, increases, or changes color please call the office.   7. Do not use creams, lotions, or ointments such as neosporin on your incisions after surgery until advised by your surgeon because they can cause removal of the dermabond glue on your incisions.     8. You may experience vaginal spotting after surgery or around the 6-8 week mark from surgery when the stitches at the top of the vagina begin to dissolve.  The spotting is normal but if you experience heavy bleeding, call our office.   9. Take Tylenol first for pain and only use narcotic pain medication for severe pain not relieved by the Tylenol.  Monitor your Tylenol intake to a max of 4,000 mg in a 24 hour period.    Diet: 1. Low sodium Heart Healthy Diet is recommended but you are cleared to resume your normal (before surgery) diet after your procedure.   2. It is safe to use a laxative, such as Miralax or Colace, if you have difficulty moving your bowels. You have been prescribed Sennakot-S to take at bedtime every evening after surgery to keep bowel movements regular and to prevent constipation.     Wound Care: 1. Keep clean and dry.   Shower daily.   Reasons to call the Doctor: Fever - Oral temperature greater than 100.4 degrees Fahrenheit Foul-smelling  vaginal discharge Difficulty urinating Nausea and vomiting Increased pain at the site of the incision that is unrelieved with pain medicine. Difficulty breathing with or without chest pain New calf pain especially if only on one side Sudden, continuing increased vaginal bleeding with or without clots.   Contacts: For questions or concerns you should contact:   Dr. Jeral Pinch at [phone]   Joylene John, NP at [phone]   After Hours: call [phone] and have the GYN Oncologist paged/contacted (after 5 pm or on the weekends).   Messages sent via mychart are for non-urgent matters and are not responded to after hours so for urgent needs, please call the after hours number.

## 2021-05-25 NOTE — Progress Notes (Signed)
Patient here for new patient consultation with Dr. Jeral Pinch and for a pre-operative discussion prior to her scheduled surgery on June 14, 2021. She is scheduled for robotic assisted total laparoscopic hysterectomy, bilateral salpingo-oophorectomy, possible staging if a cancer is identified, possible laparotomy. The surgery was discussed in detail.  See after visit summary for additional details. Visual aids used to discuss items related to surgery including sequential compression stockings, foley catheter, IV pump, multi-modal pain regimen including tylenol, photo of the surgical robot, female reproductive system to discuss surgery in detail.      Discussed post-op pain management in detail including the aspects of the enhanced recovery pathway. She is on chronic pain medication with a pain contract with her provider. She has hydrocodone/APAP at home. We discussed the use of tylenol post-op and to monitor for a maximum of 4,000 mg in a 24 hour period. Discussed bowel regimen in detail. She will continue her home regimen.    Discussed the use of SCDs and measures to take at home to prevent DVT including frequent mobility.  Reportable signs and symptoms of DVT discussed. Post-operative instructions discussed and expectations for after surgery. Incisional care discussed as well including reportable signs and symptoms including erythema, drainage, wound separation.     10 minutes spent with the patient.  Verbalizing understanding of material discussed. No needs or concerns voiced at the end of the visit. Advised patient to call for any needs.   We discussed the potential need for lovenox injections post-operatively if a cancer is identified. A handout on lovenox with administration instructions was given to the patient along with a demonstration.  This appointment is included in the global surgical bundle as pre-operative teaching and has no charge.

## 2021-05-25 NOTE — Progress Notes (Signed)
GYNECOLOGIC ONCOLOGY NEW PATIENT CONSULTATION   Patient Name: Abigail Glover  Patient Age: 75 y.o. Date of Service: 05/25/21 Referring Provider: Lucillie Garfinkel, MD  Primary Care Provider: [facility] [place] Provider: Jeral Pinch, MD   Assessment/Plan:  Postmenopausal patient with complex asymptomatic pelvic mass, elevated tumor markers.  Although I am unable to see the images, we reviewed ultrasound report and findings. This was done in the setting of postmenopausal bleeding with incidental finding of a pelvic mass. We also discussed recent lab testing which is notable for mildly elevated CA-125 and HE4. It is reassuring that the patient is asymptomatic with regard to her mass. She also does not have symptoms that would suggest metastatic disease. In the setting of her complex mass and elevated tumor markers,  I recommend that we get a CT scan to evaluate for any evidence of metastatic disease as we plan for surgery. I think this is unlikely to change the plan for surgery, but we discussed that if significant disease burden were noted that I did not think was resectable, then we may change the treatment plan discussed today.  In terms of her post menopausal bleeding, endometrial biopsy revealed polyps as well as small focus of simple hyperplasia. As we are discussing evaluation of her complex adnexal mass, I recommend that we move forward with scheduling surgery, even if her mass ends up being benign, to include a total hysterectomy.  I think the patient is a good candidate for minimally invasive surgery. We discussed a robotic approach with plan to place the adnexal mass in a bag for a controlled cyst drainage with removal and sending it to pathology for review. If benign, then would plan to proceed with remove all the other adnexa as well as her uterus and cervix. The same plan would be true if a borderline tumor was found. In the setting of malignancy, then we  discussed additional staging procedures which may include lymph node sampling and omentectomy.   Today, we discussed plan for a robotic assisted hysterectomy, bilateral salpingo-oophorectomy, possible staging including lymph node dissection and omentectomy, possible laparotomy. The risks of surgery were discussed in detail and she understands these to include infection; wound separation; hernia; vaginal cuff separation, injury to adjacent organs such as bowel, bladder, blood vessels, ureters and nerves; bleeding which may require blood transfusion; anesthesia risk; thromboembolic events; possible death; unforeseen complications; possible need for re-exploration; medical complications such as heart attack, stroke, pleural effusion and pneumonia; and, if full lymphadenectomy is performed the risk of lymphedema and lymphocyst. The patient will receive DVT and antibiotic prophylaxis as indicated. She voiced a clear understanding. She had the opportunity to ask questions. Perioperative instructions were reviewed with her. Prescriptions for post-op medications were sent to her pharmacy of choice.  Recommended that she see a dermatologist for skin lesion.   A copy of this note was sent to the patient's referring provider.   80 minutes of total time was spent for this patient encounter, including preparation, face-to-face counseling with the patient and coordination of care, and documentation of the encounter.   Jeral Pinch, MD  Division of Gynecologic Oncology  Department of Obstetrics and Gynecology  [facility]  ___________________________________________  Chief Complaint: Chief Complaint  Patient presents with   Ovarian mass    History of Present Illness:  Abigail Glover is a 75 y.o. y.o. female who is seen in consultation at the request of Dr. Royston Sinner for an evaluation of a complex  adnexal mass.  Patient presented recently for a GYN visit in the setting of  pessary maintenance and reported several episodes of light vaginal spotting.  Pessary had been used secondary to a cystocele.  Patient was also using estrogen cream but this was stopped when her ultrasound in November showed thickened endometrium.  Patient underwent endometrial biopsy on 11/1 showing fragments of endometrial polyp with foci of simple hyperplasia without atypia.  Given ultrasound findings of a left adnexal multicystic mass measuring up to 12.6 cm with thick and thin septations, no internal blood flow, tumor markers were obtained.  HE4 is elevated at 138 and CEA-125 mildly elevated at 59.  Patient notes that her original vaginal spotting lasted only a couple of days, was not associated with any pelvic pain or cramping.  She has not had any bleeding since.  She has not been using her pessary since work-up with her gynecologist.  Pessary was in place for cystocele.  Since her pessary was taken out, she does not feel a bulge or have any difficulty urinating.  She denies any pelvic or abdominal pain.  She has back pain intermittently after being on her feet at work.  She endorses a good appetite without nausea or emesis.  She has baseline history of intermittent bloating, no recent change.  She denies any recent weight changes.  She has IBS with some constipation at baseline, reports normal bowel function when she takes fiber powder and MiraLAX regularly.  Patient lives alone, daughter is nearby.  Works as a Scientist, water quality.  PAST MEDICAL HISTORY:  Past Medical History:  Diagnosis Date   Hypertension    IBS (irritable bowel syndrome)    Stage 3b chronic kidney disease (CKD) (HCC)    Trigeminal neuralgia of left side of face      PAST SURGICAL HISTORY:  Past Surgical History:  Procedure Laterality Date   CHOLECYSTECTOMY, LAPAROSCOPIC     TUBAL LIGATION      OB/GYN HISTORY:  OB History  Gravida Para Term Preterm AB Living  1 1          SAB IAB Ectopic Multiple Live Births                # Outcome Date GA Lbr Len/2nd Weight Sex Delivery Anes PTL Lv  1 Para             No LMP recorded.  Age at menarche: 26 Age at menopause: 53 Hx of HRT: Denies Hx of STDs: Denies Last pap: 2021-negative, benign cervical polyp removed at this time also History of abnormal pap smears: Denies  SCREENING STUDIES:  Last mammogram: Has never had  Last colonoscopy: Has never had   MEDICATIONS: Outpatient Encounter Medications as of 05/25/2021  Medication Sig   Cholecalciferol (VITAMIN D3) 20 MCG (800 UNIT) TABS Take 25 mg by mouth daily.   dicyclomine (BENTYL) 10 MG capsule Take 10 mg by mouth 3 (three) times daily.   gabapentin (NEURONTIN) 600 MG tablet Take 0.5 tablet - 1 tablet daily   hydrALAZINE (APRESOLINE) 25 MG tablet    HYDROcodone-acetaminophen (NORCO/VICODIN) 5-325 MG tablet    Multiple Vitamins-Minerals (B COMPLEX-C-E-ZINC) tablet Take 1 tablet by mouth daily.   olmesartan (BENICAR) 40 MG tablet Take 40 mg by mouth daily.   pantoprazole (PROTONIX) 40 MG tablet Take 40 mg by mouth daily.   rosuvastatin (CRESTOR) 5 MG tablet rosuvastatin 5 mg tablet   zolpidem (AMBIEN) 10 MG tablet zolpidem 10 mg tablet  TAKE 1 TABLET BY MOUTH  AT BEDTIME   carbamazepine (TEGRETOL) 200 MG tablet Take 1 tablet (200 mg total) by mouth 2 (two) times daily.   hydrochlorothiazide (HYDRODIURIL) 25 MG tablet Take 25 mg by mouth daily. (Patient not taking: Reported on 05/25/2021)   losartan (COZAAR) 100 MG tablet  (Patient not taking: Reported on 05/25/2021)   No facility-administered encounter medications on file as of 05/25/2021.    ALLERGIES:  Allergies  Allergen Reactions   Amitriptyline    Amlodipine    Cephalexin    Citalopram    Hydrochlorothiazide W-Triamterene    Metoprolol    Penicillin G    Prednisone    Seasonal Ic [Cholestatin]      FAMILY HISTORY:  History reviewed. No pertinent family history.   SOCIAL HISTORY:  Social Connections: Not on file    REVIEW OF  SYSTEMS:  Pertinent positives include anxiety Denies appetite changes, fevers, chills, fatigue, unexplained weight changes. Denies hearing loss, neck lumps or masses, mouth sores, ringing in ears or voice changes. Denies cough or wheezing.  Denies shortness of breath. Denies chest pain or palpitations. Denies leg swelling. Denies abdominal distention, pain, blood in stools, constipation, diarrhea, nausea, vomiting, or early satiety. Denies pain with intercourse, dysuria, frequency, hematuria or incontinence. Denies hot flashes, pelvic pain, vaginal bleeding or vaginal discharge.   Denies joint pain, back pain or muscle pain/cramps. Denies itching, rash, or wounds. Denies dizziness, headaches, numbness or seizures. Denies swollen lymph nodes or glands, denies easy bruising or bleeding. Denies depression, confusion, or decreased concentration.  Physical Exam:  Vital Signs for this encounter:  Blood pressure 140/75, pulse 86, temperature 98.9 F (37.2 C), temperature source Tympanic, resp. rate 16, height 5' (1.524 m), weight 198 lb (89.8 kg), SpO2 97 %. Body mass index is 38.67 kg/m. General: Alert, oriented, no acute distress.  HEENT: Normocephalic, atraumatic. Sclera anicteric.  Chest: Clear to auscultation bilaterally. No wheezes, rhonchi, or rales. Cardiovascular: Regular rate and rhythm, no murmurs, rubs, or gallops.  Abdomen: Obese. Normoactive bowel sounds. Soft, nondistended, nontender to palpation. No masses or hepatosplenomegaly appreciated. No palpable fluid wave.  1-2 cm somewhat verrucous appearing mole on the right mid abdomen. Extremities: Grossly normal range of motion. Warm, well perfused. No edema bilaterally.  Skin: No rashes or lesions.  Lymphatics: No cervical, supraclavicular, or inguinal adenopathy.  GU:  Normal external female genitalia. No lesions. No discharge or bleeding.             Bladder/urethra:  No lesions or masses, well supported bladder              Vagina: Mildly atrophic, no lesions or masses noted.             Cervix: Normal appearing, no lesions.             Uterus:  Small, mobile, no parametrial involvement or nodularity.             Adnexa: Mass appreciated out of the pelvis that moves in conjunction with the uterus, difficult to appreciate but what I can feel is smooth.  Rectal: Mass difficult to appreciate secondary to its location out of the pelvis, no rectal nodularity or nodularity within the cul-de-sac.  LABORATORY AND RADIOLOGIC DATA:  Outside medical records were reviewed to synthesize the above history, along with the history and physical obtained during the visit.   No results found for: WBC, HGB, HCT, PLT, GLUCOSE, CHOL, TRIG, HDL, LDLDIRECT, LDLCALC, ALT, AST, NA, K, CL, CREATININE, BUN, CO2, TSH, PSA, INR,  GLUF, HGBA1C, MICROALBUR  [facility]: Uterus measures 9.4 x 4.6 x 4.4 cm with an endometrial lining of 8.5 mm.  Left ovary replaced by a mass measuring 12.6 x 9.3 x 8.7 cm with thick and thin septations, no blood flow seen, no free fluid.  Right adnexa not seen.  Endometrial biopsy on 11/1: Fragments of endometrial polyp with foci of simple hyperplasia without atypia.  No atypia or malignancy noted.

## 2021-05-25 NOTE — H&P (View-Only) (Signed)
GYNECOLOGIC ONCOLOGY NEW PATIENT CONSULTATION   Patient Name: Abigail Glover  Patient Age: 75 y.o. Date of Service: 05/25/21 Referring Provider: Lucillie Garfinkel, MD  Primary Care Provider: [facility] [place] Provider: Jeral Pinch, MD   Assessment/Plan:  Postmenopausal patient with complex asymptomatic pelvic mass, elevated tumor markers.  Although I am unable to see the images, we reviewed ultrasound report and findings. This was done in the setting of postmenopausal bleeding with incidental finding of a pelvic mass. We also discussed recent lab testing which is notable for mildly elevated CA-125 and HE4. It is reassuring that the patient is asymptomatic with regard to her mass. She also does not have symptoms that would suggest metastatic disease. In the setting of her complex mass and elevated tumor markers,  I recommend that we get a CT scan to evaluate for any evidence of metastatic disease as we plan for surgery. I think this is unlikely to change the plan for surgery, but we discussed that if significant disease burden were noted that I did not think was resectable, then we may change the treatment plan discussed today.  In terms of her post menopausal bleeding, endometrial biopsy revealed polyps as well as small focus of simple hyperplasia. As we are discussing evaluation of her complex adnexal mass, I recommend that we move forward with scheduling surgery, even if her mass ends up being benign, to include a total hysterectomy.  I think the patient is a good candidate for minimally invasive surgery. We discussed a robotic approach with plan to place the adnexal mass in a bag for a controlled cyst drainage with removal and sending it to pathology for review. If benign, then would plan to proceed with remove all the other adnexa as well as her uterus and cervix. The same plan would be true if a borderline tumor was found. In the setting of malignancy, then we  discussed additional staging procedures which may include lymph node sampling and omentectomy.   Today, we discussed plan for a robotic assisted hysterectomy, bilateral salpingo-oophorectomy, possible staging including lymph node dissection and omentectomy, possible laparotomy. The risks of surgery were discussed in detail and she understands these to include infection; wound separation; hernia; vaginal cuff separation, injury to adjacent organs such as bowel, bladder, blood vessels, ureters and nerves; bleeding which may require blood transfusion; anesthesia risk; thromboembolic events; possible death; unforeseen complications; possible need for re-exploration; medical complications such as heart attack, stroke, pleural effusion and pneumonia; and, if full lymphadenectomy is performed the risk of lymphedema and lymphocyst. The patient will receive DVT and antibiotic prophylaxis as indicated. She voiced a clear understanding. She had the opportunity to ask questions. Perioperative instructions were reviewed with her. Prescriptions for post-op medications were sent to her pharmacy of choice.  Recommended that she see a dermatologist for skin lesion.   A copy of this note was sent to the patient's referring provider.   80 minutes of total time was spent for this patient encounter, including preparation, face-to-face counseling with the patient and coordination of care, and documentation of the encounter.   Jeral Pinch, MD  Division of Gynecologic Oncology  Department of Obstetrics and Gynecology  [facility]  ___________________________________________  Chief Complaint: Chief Complaint  Patient presents with   Ovarian mass    History of Present Illness:  Abigail Glover is a 75 y.o. y.o. female who is seen in consultation at the request of Dr. Royston Sinner for an evaluation of a complex  adnexal mass.  Patient presented recently for a GYN visit in the setting of  pessary maintenance and reported several episodes of light vaginal spotting.  Pessary had been used secondary to a cystocele.  Patient was also using estrogen cream but this was stopped when her ultrasound in November showed thickened endometrium.  Patient underwent endometrial biopsy on 11/1 showing fragments of endometrial polyp with foci of simple hyperplasia without atypia.  Given ultrasound findings of a left adnexal multicystic mass measuring up to 12.6 cm with thick and thin septations, no internal blood flow, tumor markers were obtained.  HE4 is elevated at 138 and CEA-125 mildly elevated at 59.  Patient notes that her original vaginal spotting lasted only a couple of days, was not associated with any pelvic pain or cramping.  She has not had any bleeding since.  She has not been using her pessary since work-up with her gynecologist.  Pessary was in place for cystocele.  Since her pessary was taken out, she does not feel a bulge or have any difficulty urinating.  She denies any pelvic or abdominal pain.  She has back pain intermittently after being on her feet at work.  She endorses a good appetite without nausea or emesis.  She has baseline history of intermittent bloating, no recent change.  She denies any recent weight changes.  She has IBS with some constipation at baseline, reports normal bowel function when she takes fiber powder and MiraLAX regularly.  Patient lives alone, daughter is nearby.  Works as a Scientist, water quality.  PAST MEDICAL HISTORY:  Past Medical History:  Diagnosis Date   Hypertension    IBS (irritable bowel syndrome)    Stage 3b chronic kidney disease (CKD) (HCC)    Trigeminal neuralgia of left side of face      PAST SURGICAL HISTORY:  Past Surgical History:  Procedure Laterality Date   CHOLECYSTECTOMY, LAPAROSCOPIC     TUBAL LIGATION      OB/GYN HISTORY:  OB History  Gravida Para Term Preterm AB Living  1 1          SAB IAB Ectopic Multiple Live Births                # Outcome Date GA Lbr Len/2nd Weight Sex Delivery Anes PTL Lv  1 Para             No LMP recorded.  Age at menarche: 33 Age at menopause: 19 Hx of HRT: Denies Hx of STDs: Denies Last pap: 2021-negative, benign cervical polyp removed at this time also History of abnormal pap smears: Denies  SCREENING STUDIES:  Last mammogram: Has never had  Last colonoscopy: Has never had   MEDICATIONS: Outpatient Encounter Medications as of 05/25/2021  Medication Sig   Cholecalciferol (VITAMIN D3) 20 MCG (800 UNIT) TABS Take 25 mg by mouth daily.   dicyclomine (BENTYL) 10 MG capsule Take 10 mg by mouth 3 (three) times daily.   gabapentin (NEURONTIN) 600 MG tablet Take 0.5 tablet - 1 tablet daily   hydrALAZINE (APRESOLINE) 25 MG tablet    HYDROcodone-acetaminophen (NORCO/VICODIN) 5-325 MG tablet    Multiple Vitamins-Minerals (B COMPLEX-C-E-ZINC) tablet Take 1 tablet by mouth daily.   olmesartan (BENICAR) 40 MG tablet Take 40 mg by mouth daily.   pantoprazole (PROTONIX) 40 MG tablet Take 40 mg by mouth daily.   rosuvastatin (CRESTOR) 5 MG tablet rosuvastatin 5 mg tablet   zolpidem (AMBIEN) 10 MG tablet zolpidem 10 mg tablet  TAKE 1 TABLET BY MOUTH  AT BEDTIME   carbamazepine (TEGRETOL) 200 MG tablet Take 1 tablet (200 mg total) by mouth 2 (two) times daily.   hydrochlorothiazide (HYDRODIURIL) 25 MG tablet Take 25 mg by mouth daily. (Patient not taking: Reported on 05/25/2021)   losartan (COZAAR) 100 MG tablet  (Patient not taking: Reported on 05/25/2021)   No facility-administered encounter medications on file as of 05/25/2021.    ALLERGIES:  Allergies  Allergen Reactions   Amitriptyline    Amlodipine    Cephalexin    Citalopram    Hydrochlorothiazide W-Triamterene    Metoprolol    Penicillin G    Prednisone    Seasonal Ic [Cholestatin]      FAMILY HISTORY:  History reviewed. No pertinent family history.   SOCIAL HISTORY:  Social Connections: Not on file    REVIEW OF  SYSTEMS:  Pertinent positives include anxiety Denies appetite changes, fevers, chills, fatigue, unexplained weight changes. Denies hearing loss, neck lumps or masses, mouth sores, ringing in ears or voice changes. Denies cough or wheezing.  Denies shortness of breath. Denies chest pain or palpitations. Denies leg swelling. Denies abdominal distention, pain, blood in stools, constipation, diarrhea, nausea, vomiting, or early satiety. Denies pain with intercourse, dysuria, frequency, hematuria or incontinence. Denies hot flashes, pelvic pain, vaginal bleeding or vaginal discharge.   Denies joint pain, back pain or muscle pain/cramps. Denies itching, rash, or wounds. Denies dizziness, headaches, numbness or seizures. Denies swollen lymph nodes or glands, denies easy bruising or bleeding. Denies depression, confusion, or decreased concentration.  Physical Exam:  Vital Signs for this encounter:  Blood pressure 140/75, pulse 86, temperature 98.9 F (37.2 C), temperature source Tympanic, resp. rate 16, height 5' (1.524 m), weight 198 lb (89.8 kg), SpO2 97 %. Body mass index is 38.67 kg/m. General: Alert, oriented, no acute distress.  HEENT: Normocephalic, atraumatic. Sclera anicteric.  Chest: Clear to auscultation bilaterally. No wheezes, rhonchi, or rales. Cardiovascular: Regular rate and rhythm, no murmurs, rubs, or gallops.  Abdomen: Obese. Normoactive bowel sounds. Soft, nondistended, nontender to palpation. No masses or hepatosplenomegaly appreciated. No palpable fluid wave.  1-2 cm somewhat verrucous appearing mole on the right mid abdomen. Extremities: Grossly normal range of motion. Warm, well perfused. No edema bilaterally.  Skin: No rashes or lesions.  Lymphatics: No cervical, supraclavicular, or inguinal adenopathy.  GU:  Normal external female genitalia. No lesions. No discharge or bleeding.             Bladder/urethra:  No lesions or masses, well supported bladder              Vagina: Mildly atrophic, no lesions or masses noted.             Cervix: Normal appearing, no lesions.             Uterus:  Small, mobile, no parametrial involvement or nodularity.             Adnexa: Mass appreciated out of the pelvis that moves in conjunction with the uterus, difficult to appreciate but what I can feel is smooth.  Rectal: Mass difficult to appreciate secondary to its location out of the pelvis, no rectal nodularity or nodularity within the cul-de-sac.  LABORATORY AND RADIOLOGIC DATA:  Outside medical records were reviewed to synthesize the above history, along with the history and physical obtained during the visit.   No results found for: WBC, HGB, HCT, PLT, GLUCOSE, CHOL, TRIG, HDL, LDLDIRECT, LDLCALC, ALT, AST, NA, K, CL, CREATININE, BUN, CO2, TSH, PSA, INR,  GLUF, HGBA1C, MICROALBUR  [facility]: Uterus measures 9.4 x 4.6 x 4.4 cm with an endometrial lining of 8.5 mm.  Left ovary replaced by a mass measuring 12.6 x 9.3 x 8.7 cm with thick and thin septations, no blood flow seen, no free fluid.  Right adnexa not seen.  Endometrial biopsy on 11/1: Fragments of endometrial polyp with foci of simple hyperplasia without atypia.  No atypia or malignancy noted.

## 2021-05-25 NOTE — Patient Instructions (Addendum)
Preparing for your Surgery  Plan for surgery on June 14, 2021 with Dr. Jeral Pinch at [facility] will be scheduled for robotic assisted total laparoscopic hysterectomy (removal of the uterus and cervix), bilateral salpingo-oophorectomy (removal of both ovaries and fallopian tubes), possible staging if a cancer is identified, possible laparotomy (larger incision on your abdomen if needed).   Pre-operative Testing -You will receive a phone call from presurgical testing at [facility] to arrange for a pre-operative appointment and lab work.  -Bring your insurance card, copy of an advanced directive if applicable, medication list  -At that visit, you will be asked to sign a consent for a possible blood transfusion in case a transfusion becomes necessary during surgery.  The need for a blood transfusion is rare but having consent is a necessary part of your care.     -You should not be taking blood thinners or aspirin at least ten days prior to surgery unless instructed by your surgeon.  -Do not take supplements such as fish oil (omega 3), red yeast rice, turmeric before your surgery. You want to avoid medications with aspirin in them including headache powders such as BC or Goody's), Excedrin migraine.  Day Before Surgery at [facility] will be asked to take in a light diet the day before surgery. You will be advised you can have clear liquids up until 3 hours before your surgery.    Eat a light diet the day before surgery.  Examples including soups, broths, toast, yogurt, mashed potatoes.  AVOID GAS PRODUCING FOODS. Things to avoid include carbonated beverages (fizzy beverages, sodas), raw fruits and raw vegetables (uncooked), or beans.   If your bowels are filled with gas, your surgeon will have difficulty visualizing your pelvic organs which increases your surgical risks.  Your role in recovery Your role is to become active as soon as directed by your doctor,  while still giving yourself time to heal.  Rest when you feel tired. You will be asked to do the following in order to speed your recovery:  - Cough and breathe deeply. This helps to clear and expand your lungs and can prevent pneumonia after surgery.  - [place]. Do mild physical activity. Walking or moving your legs help your circulation and body functions return to normal. Do not try to get up or walk alone the first time after surgery.   -If you develop swelling on one leg or the other, pain in the back of your leg, redness/warmth in one of your legs, please call the office or go to the Emergency Room to have a doppler to rule out a blood clot. For shortness of breath, chest pain-seek care in the Emergency Room as soon as possible. - Actively manage your pain. Managing your pain lets you move in comfort. We will ask you to rate your pain on a scale of zero to 10. It is your responsibility to tell your doctor or nurse where and how much you hurt so your pain can be treated.  Special Considerations -If you are diabetic, you may be placed on insulin after surgery to have closer control over your blood sugars to promote healing and recovery.  This does not mean that you will be discharged on insulin.  If applicable, your oral antidiabetics will be resumed when you are tolerating a solid diet.  -Your final pathology results from surgery should be available around one week after surgery and the results will be  relayed to you when available.  -Dr. Lahoma Crocker is the surgeon that assists your GYN Oncologist with surgery.  If you end up staying the night, the next day after your surgery you will either see Dr. Berline Lopes or Dr. Lahoma Crocker.  -FMLA forms can be faxed to [phone] and please allow 5-7 business days for completion.  Pain Management After Surgery  -Make sure that you have Tylenol at home to use on a regular basis after surgery for pain control. You can  continue use of your hydrocodone/APAP for pain after surgery is needed. Monitor your Tylenol intake since the hydrocodone/APAP has tylenol in it.  -Review the attached handout on narcotic use and their risks and side effects.   Bowel Regimen -You have been prescribed Sennakot-S to take nightly to prevent constipation especially if you are taking the narcotic pain medication intermittently.  It is important to prevent constipation and drink adequate amounts of liquids. You can stop taking this medication when you are not taking pain medication and you are back on your normal bowel routine.  Risks of Surgery Risks of surgery are low but include bleeding, infection, damage to surrounding structures, re-operation, blood clots, and very rarely death.   Blood Transfusion Information (For the consent to be signed before surgery)  We will be checking your blood type before surgery so in case of emergencies, we will know what type of blood you would need.                                            WHAT IS A BLOOD TRANSFUSION?  A transfusion is the replacement of blood or some of its parts. Blood is made up of multiple cells which provide different functions. Red blood cells carry oxygen and are used for blood loss replacement. White blood cells fight against infection. Platelets control bleeding. Plasma helps clot blood. Other blood products are available for specialized needs, such as hemophilia or other clotting disorders. BEFORE THE TRANSFUSION  Who gives blood for transfusions?  You may be able to donate blood to be used at a later date on yourself (autologous donation). Relatives can be asked to donate blood. This is generally not any safer than if you have received blood from a stranger. The same precautions are taken to ensure safety when a relative's blood is donated. Healthy volunteers who are fully evaluated to make sure their blood is safe. This is blood bank blood. Transfusion therapy  is the safest it has ever been in the practice of medicine. Before blood is taken from a donor, a complete history is taken to make sure that person has no history of diseases nor engages in risky social behavior (examples are intravenous drug use or sexual activity with multiple partners). The donor's travel history is screened to minimize risk of transmitting infections, such as malaria. The donated blood is tested for signs of infectious diseases, such as HIV and hepatitis. The blood is then tested to be sure it is compatible with you in order to minimize the chance of a transfusion reaction. If you or a relative donates blood, this is often done in anticipation of surgery and is not appropriate for emergency situations. It takes many days to process the donated blood. RISKS AND COMPLICATIONS Although transfusion therapy is very safe and saves many lives, the main dangers of transfusion include:  Getting an infectious  disease. Developing a transfusion reaction. This is an allergic reaction to something in the blood you were given. Every precaution is taken to prevent this. The decision to have a blood transfusion has been considered carefully by your caregiver before blood is given. Blood is not given unless the benefits outweigh the risks.  AFTER SURGERY INSTRUCTIONS  Return to work: 4-6 weeks if applicable  If a cancer is identified during surgery, you will need to administer once daily lovenox blood thinner injections for a total of 2 weeks. This is to help prevent blood clots. You would want to give the injection at the same time each day.  Activity: 1. Be up and out of the bed during the day.  Take a nap if needed.  You may walk up steps but be careful and use the hand rail.  Stair climbing will tire you more than you think, you may need to stop part way and rest.   2. No lifting or straining for 6 weeks over 10 pounds. No pushing, pulling, straining for 6 weeks.  3. No driving for 1  week(s) if you were cleared to drive before surgery.  Do not drive if you are taking narcotic pain medicine and make sure that your reaction time has returned.   4. You can shower as soon as the next day after surgery. Shower daily.  Use your regular soap and water (not directly on the incision) and pat your incision(s) dry afterwards; don't rub.  No tub baths or submerging your body in water until cleared by your surgeon. If you have the soap that was given to you by pre-surgical testing that was used before surgery, you do not need to use it afterwards because this can irritate your incisions.   5. No sexual activity and nothing in the vagina for 8 weeks.  6. You may experience a small amount of clear drainage from your incisions, which is normal.  If the drainage persists, increases, or changes color please call the office.  7. Do not use creams, lotions, or ointments such as neosporin on your incisions after surgery until advised by your surgeon because they can cause removal of the dermabond glue on your incisions.    8. You may experience vaginal spotting after surgery or around the 6-8 week mark from surgery when the stitches at the top of the vagina begin to dissolve.  The spotting is normal but if you experience heavy bleeding, call our office.  9. Take Tylenol first for pain and only use narcotic pain medication for severe pain not relieved by the Tylenol.  Monitor your Tylenol intake to a max of 4,000 mg in a 24 hour period.   Diet: 1. Low sodium Heart Healthy Diet is recommended but you are cleared to resume your normal (before surgery) diet after your procedure.  2. It is safe to use a laxative, such as Miralax or Colace, if you have difficulty moving your bowels. You have been prescribed Sennakot-S to take at bedtime every evening after surgery to keep bowel movements regular and to prevent constipation.    Wound Care: 1. Keep clean and dry.  Shower daily.  Reasons to call the  Doctor: Fever - Oral temperature greater than 100.4 degrees Fahrenheit Foul-smelling vaginal discharge Difficulty urinating Nausea and vomiting Increased pain at the site of the incision that is unrelieved with pain medicine. Difficulty breathing with or without chest pain New calf pain especially if only on one side Sudden, continuing increased vaginal  bleeding with or without clots.   Contacts: For questions or concerns you should contact:  Dr. Jeral Pinch at [phone]  Joylene John, NP at [phone]  After Hours: call [phone] and have the GYN Oncologist paged/contacted (after 5 pm or on the weekends).  Messages sent via mychart are for non-urgent matters and are not responded to after hours so for urgent needs, please call the after hours number.

## 2021-05-27 ENCOUNTER — Telehealth: Payer: Self-pay | Admitting: *Deleted

## 2021-05-27 NOTE — Telephone Encounter (Signed)
Fax records and surgical optimization

## 2021-05-31 ENCOUNTER — Telehealth: Payer: Self-pay | Admitting: *Deleted

## 2021-05-31 NOTE — Telephone Encounter (Signed)
Late entry----on 11/25 fax records ands surgical optimization form to the patient's PCP  On 11/29 called and left a message with the patient's PCP

## 2021-06-01 DIAGNOSIS — E8722 Chronic metabolic acidosis: Secondary | ICD-10-CM | POA: Diagnosis not present

## 2021-06-01 DIAGNOSIS — D631 Anemia in chronic kidney disease: Secondary | ICD-10-CM | POA: Diagnosis not present

## 2021-06-01 DIAGNOSIS — N1832 Chronic kidney disease, stage 3b: Secondary | ICD-10-CM | POA: Diagnosis not present

## 2021-06-01 DIAGNOSIS — N2581 Secondary hyperparathyroidism of renal origin: Secondary | ICD-10-CM | POA: Diagnosis not present

## 2021-06-01 DIAGNOSIS — I129 Hypertensive chronic kidney disease with stage 1 through stage 4 chronic kidney disease, or unspecified chronic kidney disease: Secondary | ICD-10-CM | POA: Diagnosis not present

## 2021-06-02 ENCOUNTER — Telehealth: Payer: Self-pay | Admitting: *Deleted

## 2021-06-02 NOTE — Telephone Encounter (Signed)
Patient called the office back and stated "I moved my CT to 12/8. But they said I needed labs and was not sure why I was not scheduled already. She said I was going to be scheduled 12/8 at 1:30 pm for labs. Also they told me to ask if my lab for pre admission testing labs can also be done at the cancer center also."   Explained that she had already had labs drawn with her kidney doctor and CT can use those labs. Explained that the lab appt will be canceled. Also explained that the pre admission labs can't be drawn here at the cancer center because some of them will not cross over.

## 2021-06-06 ENCOUNTER — Telehealth: Payer: Self-pay | Admitting: *Deleted

## 2021-06-06 ENCOUNTER — Ambulatory Visit (HOSPITAL_COMMUNITY): Payer: Medicare HMO

## 2021-06-06 NOTE — Telephone Encounter (Signed)
Return the patient's call and explained that the office tried to combine both her CT/pre op appts on the same, but had no luck. Patient verbalized understanding. Patient given the phone number to central scheduling to check each day for cancels.

## 2021-06-06 NOTE — Telephone Encounter (Signed)
Patient called and asked if her appt on 12/8 and 12/9 can be done on the same day

## 2021-06-07 ENCOUNTER — Other Ambulatory Visit (HOSPITAL_COMMUNITY): Payer: Medicare HMO

## 2021-06-07 DIAGNOSIS — C569 Malignant neoplasm of unspecified ovary: Secondary | ICD-10-CM | POA: Diagnosis not present

## 2021-06-07 DIAGNOSIS — M722 Plantar fascial fibromatosis: Secondary | ICD-10-CM | POA: Diagnosis not present

## 2021-06-07 DIAGNOSIS — Z1389 Encounter for screening for other disorder: Secondary | ICD-10-CM | POA: Diagnosis not present

## 2021-06-07 DIAGNOSIS — G894 Chronic pain syndrome: Secondary | ICD-10-CM | POA: Diagnosis not present

## 2021-06-07 DIAGNOSIS — G500 Trigeminal neuralgia: Secondary | ICD-10-CM | POA: Diagnosis not present

## 2021-06-07 DIAGNOSIS — Z79891 Long term (current) use of opiate analgesic: Secondary | ICD-10-CM | POA: Diagnosis not present

## 2021-06-07 NOTE — Progress Notes (Addendum)
PCP - Dr. Daiva Eves  Cardiologist - no  PPM/ICD -  Device Orders -  Rep Notified -   Chest x-ray -  EKG - 06-09-21 Stress Test -  ECHO -  Cardiac Cath -   Sleep Study -  CPAP -   Fasting Blood Sugar -  Checks Blood Sugar _____ times a day  Blood Thinner Instructions: Aspirin Instructions:  ERAS Protcol - PRE-SURGERY Ensure or G2-   COVID TEST- N/A COVID vaccine -no  Activity--No SOB with a flight of stairs  Anesthesia review: HTN, CKD  Patient denies shortness of breath, fever, cough and chest pain at PAT appointment   All instructions explained to the patient, with a verbal understanding of the material. Patient agrees to go over the instructions while at home for a better understanding. Patient also instructed to self quarantine after being tested for COVID-19. The opportunity to ask questions was provided.

## 2021-06-07 NOTE — Patient Instructions (Addendum)
DUE TO COVID-19 ONLY ONE VISITOR IS ALLOWED TO COME WITH YOU AND STAY IN THE WAITING ROOM ONLY DURING PRE OP AND PROCEDURE DAY OF SURGERY.   Up to two visitors ages 16+ are allowed at one time in a patient's room.  The visitors may rotate out with other people throughout the day.  Additionally, up to two children between the ages of 57 and 23 are allowed and do not count toward the number of allowed visitors.  Children within this age range must be accompanied by an adult visitor.  One adult visitor may remain with the patient overnight and must be in the room by 8 PM.            Your procedure is scheduled on: 06-14-21   Report to [facility] Main  Entrance   Report to admitting at       [place] AM     Call this number if you have problems the morning of surgery [phone]   Remember: Eat a light diet the day before surgery.  Examples including soups, broths, toast, yogurt, mashed potatoes.  Things to avoid include carbonated beverages (fizzy beverages), raw fruits and raw vegetables, or beans. NO SOLIDS AFTER MIDNIGHT you may have clear liquids until 0800 am then nothing by mouth  DRINK 2 PRESURGERY ENSURE DRINKS THE NIGHT BEFORE SURGERY AT  1000 PM  AND 1 PRESURGERY DRINK THE DAY OF THE PROCEDURE 3 HOURS PRIOR TO SCHEDULED SURGERY.   PLEASE FINISH PRESURGERY ENSURE DRINK PER SURGEON ORDER 3 HOURS PRIOR TO SCHEDULED SURGERY TIME WHICH NEEDS TO BE COMPLETED AT _0700 am________.   If your bowels are filled with gas, your surgeon will have difficulty visualizing your pelvic organs which increases your surgical risks.     CLEAR LIQUID DIET                                                                    water Black Coffee and tea, regular and decaf No Creamer                            Plain Jell-O any favor except red or purple                                  Fruit ices (not with fruit pulp)                                      Iced Popsicles                                      Carbonated beverages, regular and diet                                    Cranberry, grape and apple juices Sports drinks like Gatorade Lightly seasoned clear broth or consume(fat free) Sugar, honey syrup   _____________________________________________________________________    Abigail Glover  YOUR TEETH MORNING OF SURGERY AND RINSE YOUR MOUTH OUT, NO CHEWING GUM CANDY OR MINTS.     Take these medicines the morning of surgery with A SIP OF WATER: rosuvastatin, hydrocodone, hydralazine, pantoprazole, gabapentin  DO NOT TAKE ANY DIABETIC MEDICATIONS DAY OF YOUR SURGERY                               You may not have any metal on your body including hair pins and              piercings  Do not wear jewelry, make-up, lotions, powders,perfumes,        deodorant             Do not wear nail polish on your fingernails or toenails .  Do not shave  48 hours prior to surgery.            Do not bring valuables to the hospital. [place].  Contacts, dentures or bridgework may not be worn into surgery.       Patients discharged the day of surgery will not be allowed to drive home. IF YOU ARE HAVING SURGERY AND GOING HOME THE SAME DAY, YOU MUST HAVE AN ADULT TO DRIVE YOU HOME AND BE WITH YOU FOR 24 HOURS. YOU MAY GO HOME BY TAXI OR UBER OR ORTHERWISE, BUT AN ADULT MUST ACCOMPANY YOU HOME AND STAY WITH YOU FOR 24 HOURS.  Name and phone number of your driver:  Special Instructions: N/A              Please read over the following fact sheets you were given: _____________________________________________________________________             [facility] - Preparing for Surgery Before surgery, you can play an important role.  Because skin is not sterile, your skin needs to be as free of germs as possible.  You can reduce the number of germs on your skin by washing with CHG (chlorahexidine gluconate) soap before surgery.  CHG is an antiseptic cleaner which  kills germs and bonds with the skin to continue killing germs even after washing. Please DO NOT use if you have an allergy to CHG or antibacterial soaps.  If your skin becomes reddened/irritated stop using the CHG and inform your nurse when you arrive at Short Stay. Do not shave (including legs and underarms) for at least 48 hours prior to the first CHG shower.  You may shave your face/neck. Please follow these instructions carefully:  1.  Shower with CHG Soap the night before surgery and the  morning of Surgery.  2.  If you choose to wash your hair, wash your hair first as usual with your  normal  shampoo.  3.  After you shampoo, rinse your hair and body thoroughly to remove the  shampoo.                           4.  Use CHG as you would any other liquid soap.  You can apply chg directly  to the skin and wash                       Gently with a scrungie or clean washcloth.  5.  Apply the CHG Soap to  your body ONLY FROM THE NECK DOWN.   Do not use on face/ open                           Wound or open sores. Avoid contact with eyes, ears mouth and genitals (private parts).                       Wash face,  Genitals (private parts) with your normal soap.             6.  Wash thoroughly, paying special attention to the area where your surgery  will be performed.  7.  Thoroughly rinse your body with warm water from the neck down.  8.  DO NOT shower/wash with your normal soap after using and rinsing off  the CHG Soap.                9.  Pat yourself dry with a clean towel.            10.  Wear clean pajamas.            11.  Place clean sheets on your bed the night of your first shower and do not  sleep with pets. Day of Surgery : Do not apply any lotions/deodorants the morning of surgery.  Please wear clean clothes to the hospital/surgery center.  FAILURE TO FOLLOW THESE INSTRUCTIONS MAY RESULT IN THE CANCELLATION OF YOUR SURGERY PATIENT SIGNATURE_________________________________  NURSE  SIGNATURE__________________________________  ________________________________________________________________________

## 2021-06-08 NOTE — Telephone Encounter (Signed)
Called and spoke with the office and refax records per their request

## 2021-06-09 ENCOUNTER — Other Ambulatory Visit: Payer: Medicare HMO

## 2021-06-09 ENCOUNTER — Ambulatory Visit (HOSPITAL_COMMUNITY)
Admission: RE | Admit: 2021-06-09 | Discharge: 2021-06-09 | Disposition: A | Discharge status: Home / Self Care | Payer: Medicare HMO | Source: Ambulatory Visit | Attending: Gynecologic Oncology | Admitting: Gynecologic Oncology

## 2021-06-09 ENCOUNTER — Other Ambulatory Visit: Payer: Self-pay

## 2021-06-09 ENCOUNTER — Encounter (HOSPITAL_COMMUNITY)
Admission: RE | Admit: 2021-06-09 | Discharge: 2021-06-09 | Disposition: A | Discharge status: Home / Self Care | Payer: Medicare HMO | Source: Ambulatory Visit | Attending: Gynecologic Oncology | Admitting: Gynecologic Oncology

## 2021-06-09 DIAGNOSIS — N83202 Unspecified ovarian cyst, left side: Secondary | ICD-10-CM | POA: Diagnosis not present

## 2021-06-09 DIAGNOSIS — N83201 Unspecified ovarian cyst, right side: Secondary | ICD-10-CM | POA: Diagnosis not present

## 2021-06-09 DIAGNOSIS — N838 Other noninflammatory disorders of ovary, fallopian tube and broad ligament: Secondary | ICD-10-CM | POA: Diagnosis not present

## 2021-06-09 DIAGNOSIS — N839 Noninflammatory disorder of ovary, fallopian tube and broad ligament, unspecified: Secondary | ICD-10-CM | POA: Diagnosis not present

## 2021-06-09 DIAGNOSIS — I700 Atherosclerosis of aorta: Secondary | ICD-10-CM | POA: Diagnosis not present

## 2021-06-09 DIAGNOSIS — Z01818 Encounter for other preprocedural examination: Secondary | ICD-10-CM | POA: Diagnosis not present

## 2021-06-09 DIAGNOSIS — Q8909 Congenital malformations of spleen: Secondary | ICD-10-CM | POA: Diagnosis not present

## 2021-06-09 DIAGNOSIS — R978 Other abnormal tumor markers: Secondary | ICD-10-CM | POA: Diagnosis not present

## 2021-06-09 LAB — CBC
HCT: 34 % — ABNORMAL LOW (ref 36.0–46.0)
Hemoglobin: 10.8 g/dL — ABNORMAL LOW (ref 12.0–15.0)
MCH: 30.2 pg (ref 26.0–34.0)
MCHC: 31.8 g/dL (ref 30.0–36.0)
MCV: 95 fL (ref 80.0–100.0)
Platelets: 281 10*3/uL (ref 150–400)
RBC: 3.58 MIL/uL — ABNORMAL LOW (ref 3.87–5.11)
RDW: 12.5 % (ref 11.5–15.5)
WBC: 8.1 10*3/uL (ref 4.0–10.5)
nRBC: 0 % (ref 0.0–0.2)

## 2021-06-09 LAB — COMPREHENSIVE METABOLIC PANEL
ALT: 14 U/L (ref 0–44)
AST: 16 U/L (ref 15–41)
Albumin: 4.4 g/dL (ref 3.5–5.0)
Alkaline Phosphatase: 71 U/L (ref 38–126)
Anion gap: 8 (ref 5–15)
BUN: 24 mg/dL — ABNORMAL HIGH (ref 8–23)
CO2: 23 mmol/L (ref 22–32)
Calcium: 9.9 mg/dL (ref 8.9–10.3)
Chloride: 107 mmol/L (ref 98–111)
Creatinine, Ser: 1.17 mg/dL — ABNORMAL HIGH (ref 0.44–1.00)
GFR, Estimated: 49 mL/min — ABNORMAL LOW (ref >60)
Glucose, Bld: 112 mg/dL — ABNORMAL HIGH (ref 70–99)
Potassium: 4.3 mmol/L (ref 3.5–5.1)
Sodium: 138 mmol/L (ref 135–145)
Total Bilirubin: 0.6 mg/dL (ref 0.3–1.2)
Total Protein: 7.6 g/dL (ref 6.5–8.1)

## 2021-06-09 MED ORDER — IOHEXOL 9 MG/ML PO SOLN
ORAL | Status: AC
  Filled 2021-06-09: qty 1000

## 2021-06-09 MED ORDER — SODIUM CHLORIDE (PF) 0.9 % IJ SOLN
INTRAMUSCULAR | Status: AC
  Filled 2021-06-09: qty 50

## 2021-06-09 MED ORDER — IOHEXOL 9 MG/ML PO SOLN: 1000.0000 mL | ORAL | Status: AC

## 2021-06-09 MED ORDER — IOHEXOL 350 MG/ML SOLN
80.0000 mL | Freq: Once | INTRAVENOUS | Status: AC | PRN
  Administered 2021-06-09: 80 mL via INTRAVENOUS

## 2021-06-10 ENCOUNTER — Other Ambulatory Visit: Payer: Self-pay

## 2021-06-10 ENCOUNTER — Encounter (HOSPITAL_COMMUNITY): Payer: Self-pay

## 2021-06-10 ENCOUNTER — Encounter (HOSPITAL_COMMUNITY)
Admission: RE | Admit: 2021-06-10 | Discharge: 2021-06-10 | Disposition: A | Discharge status: Home / Self Care | Payer: Medicare HMO | Source: Ambulatory Visit | Attending: Gynecologic Oncology | Admitting: Gynecologic Oncology

## 2021-06-10 DIAGNOSIS — R978 Other abnormal tumor markers: Secondary | ICD-10-CM | POA: Diagnosis not present

## 2021-06-10 DIAGNOSIS — N839 Noninflammatory disorder of ovary, fallopian tube and broad ligament, unspecified: Secondary | ICD-10-CM | POA: Diagnosis not present

## 2021-06-10 DIAGNOSIS — Z01818 Encounter for other preprocedural examination: Secondary | ICD-10-CM | POA: Diagnosis not present

## 2021-06-10 DIAGNOSIS — N838 Other noninflammatory disorders of ovary, fallopian tube and broad ligament: Secondary | ICD-10-CM

## 2021-06-10 HISTORY — DX: Gastro-esophageal reflux disease without esophagitis: K21.9

## 2021-06-10 HISTORY — DX: Unspecified osteoarthritis, unspecified site: M19.90

## 2021-06-10 HISTORY — DX: Pneumonia, unspecified organism: J18.9

## 2021-06-10 HISTORY — DX: Anxiety disorder, unspecified: F41.9

## 2021-06-10 HISTORY — DX: Prediabetes: R73.03

## 2021-06-10 HISTORY — DX: Anemia, unspecified: D64.9

## 2021-06-13 ENCOUNTER — Telehealth: Payer: Self-pay

## 2021-06-13 DIAGNOSIS — N838 Other noninflammatory disorders of ovary, fallopian tube and broad ligament: Secondary | ICD-10-CM | POA: Diagnosis not present

## 2021-06-13 DIAGNOSIS — Z01818 Encounter for other preprocedural examination: Secondary | ICD-10-CM | POA: Diagnosis not present

## 2021-06-13 DIAGNOSIS — I10 Essential (primary) hypertension: Secondary | ICD-10-CM | POA: Diagnosis not present

## 2021-06-13 DIAGNOSIS — Z1331 Encounter for screening for depression: Secondary | ICD-10-CM | POA: Diagnosis not present

## 2021-06-13 DIAGNOSIS — N1831 Chronic kidney disease, stage 3a: Secondary | ICD-10-CM | POA: Diagnosis not present

## 2021-06-13 NOTE — Telephone Encounter (Signed)
Received surgical optimization form and fax to pre surgical center

## 2021-06-13 NOTE — Telephone Encounter (Signed)
Telephone call to check on pre-operative status. Patient compliant with pre-operative instructions. Reinforced nothing to eat after midnight, clear liquids until 0700.  Patient to arrive at 07:45 am. No questions or concerns voiced.  Instructed to call for any needs.

## 2021-06-13 NOTE — Telephone Encounter (Signed)
Left message requesting return call. Re: Pre-op Phone Call.

## 2021-06-14 ENCOUNTER — Ambulatory Visit (HOSPITAL_COMMUNITY)
Admission: RE | Admit: 2021-06-14 | Discharge: 2021-06-14 | Disposition: A | Discharge status: Home / Self Care | Payer: Medicare HMO | Attending: Gynecologic Oncology | Admitting: Gynecologic Oncology

## 2021-06-14 ENCOUNTER — Ambulatory Visit (HOSPITAL_COMMUNITY): Payer: Medicare HMO | Admitting: Certified Registered Nurse Anesthetist

## 2021-06-14 ENCOUNTER — Encounter (HOSPITAL_COMMUNITY)
Admission: RE | Disposition: A | Discharge status: Home / Self Care | Payer: Self-pay | Source: Home / Self Care | Attending: Gynecologic Oncology

## 2021-06-14 ENCOUNTER — Encounter (HOSPITAL_COMMUNITY): Payer: Self-pay | Admitting: Gynecologic Oncology

## 2021-06-14 DIAGNOSIS — K589 Irritable bowel syndrome without diarrhea: Secondary | ICD-10-CM | POA: Diagnosis not present

## 2021-06-14 DIAGNOSIS — N83202 Unspecified ovarian cyst, left side: Secondary | ICD-10-CM | POA: Diagnosis not present

## 2021-06-14 DIAGNOSIS — E669 Obesity, unspecified: Secondary | ICD-10-CM | POA: Diagnosis not present

## 2021-06-14 DIAGNOSIS — D270 Benign neoplasm of right ovary: Secondary | ICD-10-CM | POA: Insufficient documentation

## 2021-06-14 DIAGNOSIS — N836 Hematosalpinx: Secondary | ICD-10-CM | POA: Diagnosis not present

## 2021-06-14 DIAGNOSIS — E785 Hyperlipidemia, unspecified: Secondary | ICD-10-CM | POA: Diagnosis not present

## 2021-06-14 DIAGNOSIS — N888 Other specified noninflammatory disorders of cervix uteri: Secondary | ICD-10-CM | POA: Diagnosis not present

## 2021-06-14 DIAGNOSIS — G500 Trigeminal neuralgia: Secondary | ICD-10-CM | POA: Insufficient documentation

## 2021-06-14 DIAGNOSIS — N838 Other noninflammatory disorders of ovary, fallopian tube and broad ligament: Secondary | ICD-10-CM

## 2021-06-14 DIAGNOSIS — R7303 Prediabetes: Secondary | ICD-10-CM | POA: Diagnosis not present

## 2021-06-14 DIAGNOSIS — I129 Hypertensive chronic kidney disease with stage 1 through stage 4 chronic kidney disease, or unspecified chronic kidney disease: Secondary | ICD-10-CM | POA: Diagnosis not present

## 2021-06-14 DIAGNOSIS — N8003 Adenomyosis of the uterus: Secondary | ICD-10-CM | POA: Insufficient documentation

## 2021-06-14 DIAGNOSIS — N840 Polyp of corpus uteri: Secondary | ICD-10-CM | POA: Insufficient documentation

## 2021-06-14 DIAGNOSIS — F419 Anxiety disorder, unspecified: Secondary | ICD-10-CM | POA: Diagnosis not present

## 2021-06-14 DIAGNOSIS — K668 Other specified disorders of peritoneum: Secondary | ICD-10-CM | POA: Diagnosis not present

## 2021-06-14 DIAGNOSIS — R978 Other abnormal tumor markers: Secondary | ICD-10-CM | POA: Diagnosis not present

## 2021-06-14 DIAGNOSIS — N1832 Chronic kidney disease, stage 3b: Secondary | ICD-10-CM | POA: Insufficient documentation

## 2021-06-14 DIAGNOSIS — Z87891 Personal history of nicotine dependence: Secondary | ICD-10-CM | POA: Diagnosis not present

## 2021-06-14 DIAGNOSIS — Z6838 Body mass index (BMI) 38.0-38.9, adult: Secondary | ICD-10-CM | POA: Insufficient documentation

## 2021-06-14 DIAGNOSIS — N8501 Benign endometrial hyperplasia: Secondary | ICD-10-CM

## 2021-06-14 DIAGNOSIS — Z780 Asymptomatic menopausal state: Secondary | ICD-10-CM | POA: Diagnosis not present

## 2021-06-14 DIAGNOSIS — M1990 Unspecified osteoarthritis, unspecified site: Secondary | ICD-10-CM | POA: Insufficient documentation

## 2021-06-14 DIAGNOSIS — N83201 Unspecified ovarian cyst, right side: Secondary | ICD-10-CM | POA: Diagnosis not present

## 2021-06-14 DIAGNOSIS — N839 Noninflammatory disorder of ovary, fallopian tube and broad ligament, unspecified: Secondary | ICD-10-CM | POA: Diagnosis not present

## 2021-06-14 DIAGNOSIS — D649 Anemia, unspecified: Secondary | ICD-10-CM | POA: Diagnosis not present

## 2021-06-14 DIAGNOSIS — N950 Postmenopausal bleeding: Secondary | ICD-10-CM | POA: Diagnosis not present

## 2021-06-14 DIAGNOSIS — D271 Benign neoplasm of left ovary: Secondary | ICD-10-CM | POA: Insufficient documentation

## 2021-06-14 DIAGNOSIS — K219 Gastro-esophageal reflux disease without esophagitis: Secondary | ICD-10-CM | POA: Insufficient documentation

## 2021-06-14 HISTORY — PX: ROBOTIC ASSISTED TOTAL HYSTERECTOMY WITH BILATERAL SALPINGO OOPHERECTOMY: SHX6086

## 2021-06-14 LAB — TYPE AND SCREEN
ABO/RH(D): A POS
Antibody Screen: NEGATIVE

## 2021-06-14 LAB — ABO/RH: ABO/RH(D): A POS

## 2021-06-14 SURGERY — HYSTERECTOMY, TOTAL, ROBOT-ASSISTED, LAPAROSCOPIC, WITH BILATERAL SALPINGO-OOPHORECTOMY
Anesthesia: General | Laterality: Bilateral

## 2021-06-14 MED ORDER — OXYCODONE HCL 5 MG PO TABS
5.0000 mg | ORAL_TABLET | Freq: Once | ORAL | Status: AC | PRN
  Administered 2021-06-14: 5 mg via ORAL

## 2021-06-14 MED ORDER — ONDANSETRON HCL 4 MG/2ML IJ SOLN
INTRAMUSCULAR | Status: AC
  Filled 2021-06-14: qty 2

## 2021-06-14 MED ORDER — FENTANYL CITRATE (PF) 100 MCG/2ML IJ SOLN
INTRAMUSCULAR | Status: DC | PRN
  Administered 2021-06-14 (×4): 50 ug via INTRAVENOUS

## 2021-06-14 MED ORDER — AMISULPRIDE (ANTIEMETIC) 5 MG/2ML IV SOLN: 10.0000 mg | Freq: Once | INTRAVENOUS | Status: DC | PRN

## 2021-06-14 MED ORDER — ENSURE PRE-SURGERY PO LIQD: 296.0000 mL | Freq: Once | ORAL | Status: DC

## 2021-06-14 MED ORDER — ACETAMINOPHEN 500 MG PO TABS
1000.0000 mg | ORAL_TABLET | ORAL | Status: AC
  Administered 2021-06-14: 1000 mg via ORAL
  Filled 2021-06-14: qty 2

## 2021-06-14 MED ORDER — LACTATED RINGERS IR SOLN
Status: DC | PRN
  Administered 2021-06-14: 1000 mL

## 2021-06-14 MED ORDER — LACTATED RINGERS IV SOLN: INTRAVENOUS | Status: DC

## 2021-06-14 MED ORDER — HEPARIN SODIUM (PORCINE) 5000 UNIT/ML IJ SOLN
5000.0000 [IU] | INTRAMUSCULAR | Status: AC
  Administered 2021-06-14: 5000 [IU] via SUBCUTANEOUS
  Filled 2021-06-14: qty 1

## 2021-06-14 MED ORDER — FENTANYL CITRATE PF 50 MCG/ML IJ SOSY
PREFILLED_SYRINGE | INTRAMUSCULAR | Status: AC
  Filled 2021-06-14: qty 1

## 2021-06-14 MED ORDER — FENTANYL CITRATE (PF) 100 MCG/2ML IJ SOLN
INTRAMUSCULAR | Status: AC
  Filled 2021-06-14: qty 2

## 2021-06-14 MED ORDER — ONDANSETRON HCL 4 MG/2ML IJ SOLN: 4.0000 mg | Freq: Once | INTRAMUSCULAR | Status: DC | PRN

## 2021-06-14 MED ORDER — OXYCODONE HCL 5 MG/5ML PO SOLN: 5.0000 mg | Freq: Once | ORAL | Status: AC | PRN

## 2021-06-14 MED ORDER — PHENYLEPHRINE 40 MCG/ML (10ML) SYRINGE FOR IV PUSH (FOR BLOOD PRESSURE SUPPORT)
PREFILLED_SYRINGE | INTRAVENOUS | Status: DC | PRN
  Administered 2021-06-14: 120 ug via INTRAVENOUS

## 2021-06-14 MED ORDER — ONDANSETRON HCL 4 MG/2ML IJ SOLN
INTRAMUSCULAR | Status: DC | PRN
  Administered 2021-06-14: 4 mg via INTRAVENOUS

## 2021-06-14 MED ORDER — BUPIVACAINE HCL 0.25 % IJ SOLN
INTRAMUSCULAR | Status: DC | PRN
  Administered 2021-06-14: 33 mL

## 2021-06-14 MED ORDER — LIDOCAINE HCL (PF) 2 % IJ SOLN
INTRAMUSCULAR | Status: DC | PRN
  Administered 2021-06-14: 1.5 mg/kg/h via INTRADERMAL

## 2021-06-14 MED ORDER — CHLORHEXIDINE GLUCONATE 0.12 % MT SOLN
15.0000 mL | Freq: Once | OROMUCOSAL | Status: AC
  Administered 2021-06-14: 15 mL via OROMUCOSAL

## 2021-06-14 MED ORDER — EPHEDRINE SULFATE-NACL 50-0.9 MG/10ML-% IV SOSY
PREFILLED_SYRINGE | INTRAVENOUS | Status: DC | PRN
  Administered 2021-06-14: 10 mg via INTRAVENOUS

## 2021-06-14 MED ORDER — KETAMINE HCL 10 MG/ML IJ SOLN
INTRAMUSCULAR | Status: DC | PRN
  Administered 2021-06-14: 20 mg via INTRAVENOUS

## 2021-06-14 MED ORDER — CLINDAMYCIN PHOSPHATE 900 MG/50ML IV SOLN
900.0000 mg | INTRAVENOUS | Status: AC
  Administered 2021-06-14: 900 mg via INTRAVENOUS
  Filled 2021-06-14: qty 50

## 2021-06-14 MED ORDER — CIPROFLOXACIN IN D5W 400 MG/200ML IV SOLN
400.0000 mg | INTRAVENOUS | Status: AC
  Administered 2021-06-14: 400 mg via INTRAVENOUS
  Filled 2021-06-14: qty 200

## 2021-06-14 MED ORDER — DEXAMETHASONE SODIUM PHOSPHATE 4 MG/ML IJ SOLN: 4.0000 mg | INTRAMUSCULAR | Status: DC

## 2021-06-14 MED ORDER — ORAL CARE MOUTH RINSE: 15.0000 mL | Freq: Once | OROMUCOSAL | Status: AC

## 2021-06-14 MED ORDER — PROPOFOL 10 MG/ML IV BOLUS
INTRAVENOUS | Status: DC | PRN
  Administered 2021-06-14: 160 mg via INTRAVENOUS

## 2021-06-14 MED ORDER — MIDAZOLAM HCL 2 MG/2ML IJ SOLN
INTRAMUSCULAR | Status: AC
  Filled 2021-06-14: qty 2

## 2021-06-14 MED ORDER — BUPIVACAINE HCL 0.25 % IJ SOLN
INTRAMUSCULAR | Status: AC
  Filled 2021-06-14: qty 1

## 2021-06-14 MED ORDER — OXYCODONE HCL 5 MG PO TABS
ORAL_TABLET | ORAL | Status: AC
  Filled 2021-06-14: qty 1

## 2021-06-14 MED ORDER — PHENYLEPHRINE HCL-NACL 20-0.9 MG/250ML-% IV SOLN
INTRAVENOUS | Status: DC | PRN
  Administered 2021-06-14: 30 ug/min via INTRAVENOUS

## 2021-06-14 MED ORDER — KETAMINE HCL-SODIUM CHLORIDE 100-0.9 MG/10ML-% IV SOSY
PREFILLED_SYRINGE | INTRAVENOUS | Status: AC
  Filled 2021-06-14: qty 10

## 2021-06-14 MED ORDER — ROCURONIUM BROMIDE 10 MG/ML (PF) SYRINGE
PREFILLED_SYRINGE | INTRAVENOUS | Status: DC | PRN
  Administered 2021-06-14 (×2): 10 mg via INTRAVENOUS
  Administered 2021-06-14: 70 mg via INTRAVENOUS

## 2021-06-14 MED ORDER — PROPOFOL 10 MG/ML IV BOLUS
INTRAVENOUS | Status: AC
  Filled 2021-06-14: qty 20

## 2021-06-14 MED ORDER — DEXAMETHASONE SODIUM PHOSPHATE 10 MG/ML IJ SOLN
INTRAMUSCULAR | Status: DC | PRN
  Administered 2021-06-14: 10 mg via INTRAVENOUS

## 2021-06-14 MED ORDER — LIDOCAINE 2% (20 MG/ML) 5 ML SYRINGE
INTRAMUSCULAR | Status: DC | PRN
Start: 2021-06-14 — End: 2021-06-14
  Administered 2021-06-14: 60 mg via INTRAVENOUS

## 2021-06-14 MED ORDER — FENTANYL CITRATE PF 50 MCG/ML IJ SOSY
25.0000 ug | PREFILLED_SYRINGE | INTRAMUSCULAR | Status: DC | PRN
  Administered 2021-06-14 (×4): 25 ug via INTRAVENOUS

## 2021-06-14 MED ORDER — DEXAMETHASONE SODIUM PHOSPHATE 10 MG/ML IJ SOLN
INTRAMUSCULAR | Status: AC
  Filled 2021-06-14: qty 1

## 2021-06-14 MED ORDER — MIDAZOLAM HCL 5 MG/5ML IJ SOLN
INTRAMUSCULAR | Status: DC | PRN
  Administered 2021-06-14: 2 mg via INTRAVENOUS

## 2021-06-14 MED ORDER — SUGAMMADEX SODIUM 200 MG/2ML IV SOLN
INTRAVENOUS | Status: DC | PRN
  Administered 2021-06-14: 200 mg via INTRAVENOUS

## 2021-06-14 SURGICAL SUPPLY — 75 items
AGENT HMST KT MTR STRL THRMB (HEMOSTASIS)
APL ESCP 34 STRL LF DISP (HEMOSTASIS)
APPLICATOR SURGIFLO ENDO (HEMOSTASIS) IMPLANT
BAG COUNTER SPONGE SURGICOUNT (BAG) IMPLANT
BAG LAPAROSCOPIC 12 15 PORT 16 (BASKET) IMPLANT
BAG RETRIEVAL 12/15 (BASKET) ×2
BAG RETRIEVAL 12/15MM (BASKET) ×1
BAG SPNG CNTER NS LX DISP (BAG)
BAG SURGICOUNT SPONGE COUNTING (BAG)
BLADE SURG SZ10 CARB STEEL (BLADE) IMPLANT
COVER BACK TABLE 60X90IN (DRAPES) ×3 IMPLANT
COVER TIP SHEARS 8 DVNC (MISCELLANEOUS) ×1 IMPLANT
COVER TIP SHEARS 8MM DA VINCI (MISCELLANEOUS) ×3
DECANTER SPIKE VIAL GLASS SM (MISCELLANEOUS) ×3 IMPLANT
DERMABOND ADVANCED (GAUZE/BANDAGES/DRESSINGS) ×2
DERMABOND ADVANCED .7 DNX12 (GAUZE/BANDAGES/DRESSINGS) ×1 IMPLANT
DRAPE ARM DVNC X/XI (DISPOSABLE) ×4 IMPLANT
DRAPE COLUMN DVNC XI (DISPOSABLE) ×1 IMPLANT
DRAPE DA VINCI XI ARM (DISPOSABLE) ×12
DRAPE DA VINCI XI COLUMN (DISPOSABLE) ×3
DRAPE SHEET LG 3/4 BI-LAMINATE (DRAPES) ×3 IMPLANT
DRAPE SURG IRRIG POUCH 19X23 (DRAPES) ×3 IMPLANT
DRSG OPSITE POSTOP 4X6 (GAUZE/BANDAGES/DRESSINGS) IMPLANT
DRSG OPSITE POSTOP 4X8 (GAUZE/BANDAGES/DRESSINGS) IMPLANT
ELECT PENCIL ROCKER SW 15FT (MISCELLANEOUS) IMPLANT
ELECT REM PT RETURN 15FT ADLT (MISCELLANEOUS) ×3 IMPLANT
GAUZE 4X4 16PLY ~~LOC~~+RFID DBL (SPONGE) ×3 IMPLANT
GLOVE SURG ENC MOIS LTX SZ6 (GLOVE) ×12 IMPLANT
GLOVE SURG ENC MOIS LTX SZ6.5 (GLOVE) ×6 IMPLANT
GOWN STRL REUS W/ TWL LRG LVL3 (GOWN DISPOSABLE) ×4 IMPLANT
GOWN STRL REUS W/TWL LRG LVL3 (GOWN DISPOSABLE) ×12
HOLDER FOLEY CATH W/STRAP (MISCELLANEOUS) IMPLANT
IRRIG SUCT STRYKERFLOW 2 WTIP (MISCELLANEOUS) ×3
IRRIGATION SUCT STRKRFLW 2 WTP (MISCELLANEOUS) ×1 IMPLANT
KIT PROCEDURE DA VINCI SI (MISCELLANEOUS)
KIT PROCEDURE DVNC SI (MISCELLANEOUS) IMPLANT
KIT TURNOVER KIT A (KITS) IMPLANT
MANIPULATOR ADVINCU DEL 3.0 PL (MISCELLANEOUS) IMPLANT
MANIPULATOR ADVINCU DEL 3.5 PL (MISCELLANEOUS) IMPLANT
MANIPULATOR UTERINE 4.5 ZUMI (MISCELLANEOUS) IMPLANT
NDL HYPO 21X1.5 SAFETY (NEEDLE) ×1 IMPLANT
NDL SPNL 18GX3.5 QUINCKE PK (NEEDLE) IMPLANT
NEEDLE HYPO 21X1.5 SAFETY (NEEDLE) ×3 IMPLANT
NEEDLE SPNL 18GX3.5 QUINCKE PK (NEEDLE) IMPLANT
OBTURATOR OPTICAL STANDARD 8MM (TROCAR) ×3
OBTURATOR OPTICAL STND 8 DVNC (TROCAR) ×1
OBTURATOR OPTICALSTD 8 DVNC (TROCAR) ×1 IMPLANT
PACK ROBOT GYN CUSTOM WL (TRAY / TRAY PROCEDURE) ×3 IMPLANT
PAD POSITIONING PINK XL (MISCELLANEOUS) ×3 IMPLANT
PORT ACCESS TROCAR AIRSEAL 12 (TROCAR) ×1 IMPLANT
PORT ACCESS TROCAR AIRSEAL 5M (TROCAR) ×2
POUCH SPECIMEN RETRIEVAL 10MM (ENDOMECHANICALS) ×2 IMPLANT
SCRUB EXIDINE 4% CHG 4OZ (MISCELLANEOUS) ×3 IMPLANT
SEAL CANN UNIV 5-8 DVNC XI (MISCELLANEOUS) ×4 IMPLANT
SEAL XI 5MM-8MM UNIVERSAL (MISCELLANEOUS) ×12
SET IRRIG Y TYPE TUR BLADDER L (SET/KITS/TRAYS/PACK) ×2 IMPLANT
SET TRI-LUMEN FLTR TB AIRSEAL (TUBING) ×3 IMPLANT
SLEEVE SURGEON STRL (DRAPES) ×2 IMPLANT
SPONGE T-LAP 18X18 ~~LOC~~+RFID (SPONGE) IMPLANT
SURGIFLO W/THROMBIN 8M KIT (HEMOSTASIS) IMPLANT
SUT MNCRL AB 4-0 PS2 18 (SUTURE) IMPLANT
SUT PDS AB 1 TP1 96 (SUTURE) IMPLANT
SUT VIC AB 0 CT1 27 (SUTURE)
SUT VIC AB 0 CT1 27XBRD ANTBC (SUTURE) IMPLANT
SUT VIC AB 2-0 CT1 27 (SUTURE)
SUT VIC AB 2-0 CT1 TAPERPNT 27 (SUTURE) IMPLANT
SUT VIC AB 4-0 PS2 18 (SUTURE) ×6 IMPLANT
SYR 10ML LL (SYRINGE) IMPLANT
TOWEL OR NON WOVEN STRL DISP B (DISPOSABLE) IMPLANT
TRAP SPECIMEN MUCUS 40CC (MISCELLANEOUS) IMPLANT
TRAY FOLEY MTR SLVR 16FR STAT (SET/KITS/TRAYS/PACK) ×3 IMPLANT
TROCAR XCEL NON-BLD 5MMX100MML (ENDOMECHANICALS) IMPLANT
UNDERPAD 30X36 HEAVY ABSORB (UNDERPADS AND DIAPERS) ×6 IMPLANT
WATER STERILE IRR 1000ML POUR (IV SOLUTION) ×3 IMPLANT
YANKAUER SUCT BULB TIP 10FT TU (MISCELLANEOUS) IMPLANT

## 2021-06-14 NOTE — Transfer of Care (Signed)
Immediate Anesthesia Transfer of Care Note  Patient: Abigail Glover  Procedure(s) Performed: XI ROBOTIC ASSISTED TOTAL HYSTERECTOMY WITH BILATERAL SALPINGO OOPHORECTOMY, CYSTOSCOPY (Bilateral)  Patient Location: PACU  Anesthesia Type:General  Level of Consciousness: drowsy and patient cooperative  Airway & Oxygen Therapy: Patient Spontanous Breathing and Patient connected to face mask oxygen  Post-op Assessment: Report given to RN and Post -op Vital signs reviewed and stable  Post vital signs: Reviewed and stable  Last Vitals:  Vitals Value Taken Time  BP 150/84 06/14/21 1249  Temp    Pulse 71 06/14/21 1250  Resp 18 06/14/21 1250  SpO2 99 % 06/14/21 1250  Vitals shown include unvalidated device data.  Last Pain:  Vitals:   06/14/21 1249  TempSrc:   PainSc: Asleep         Complications: No notable events documented.

## 2021-06-14 NOTE — Anesthesia Procedure Notes (Signed)
Procedure Name: Intubation Date/Time: 06/14/2021 10:00 AM Performed by: West Pugh, CRNA Pre-anesthesia Checklist: Patient identified, Emergency Drugs available, Suction available, Patient being monitored and Timeout performed Patient Re-evaluated:Patient Re-evaluated prior to induction Oxygen Delivery Method: Circle system utilized Preoxygenation: Pre-oxygenation with 100% oxygen Induction Type: IV induction and Cricoid Pressure applied Ventilation: Mask ventilation without difficulty, Oral airway inserted - appropriate to patient size and Mask ventilation throughout procedure Laryngoscope Size: Glidescope and 3 Grade View: Grade II Tube type: Oral Tube size: 7.0 mm Number of attempts: 3 Airway Equipment and Method: Rigid stylet and Video-laryngoscopy Placement Confirmation: positive ETCO2, ETT inserted through vocal cords under direct vision and breath sounds checked- equal and bilateral Secured at: 21 cm Tube secured with: Tape Dental Injury: Teeth and Oropharynx as per pre-operative assessment  Difficulty Due To: Difficulty was anticipated, Difficult Airway- due to anterior larynx and Difficult Airway- due to limited oral opening Comments: DL x 1 by Theodora Blow, SRNA with Grade 3 view and attempt unsuccessful, DL x1 by Dr Elgie Congo, glidescope obtained. Dr Elgie Congo intubated utilizing glidescope. Mask ventilation with oral airway 79mm.

## 2021-06-14 NOTE — Discharge Instructions (Signed)

## 2021-06-14 NOTE — Interval H&P Note (Signed)
History and Physical Interval Note:  06/14/2021 7:43 AM  Abigail Glover  has presented today for surgery, with the diagnosis of OVARIAN MASS ELEVATED TUMOR MARKERS.  The various methods of treatment have been discussed with the patient and family. After consideration of risks, benefits and other options for treatment, the patient has consented to  Procedure(s): XI ROBOTIC ASSISTED TOTAL HYSTERECTOMY WITH BILATERAL SALPINGO OOPHORECTOMY;POSSIBLE STAGING; POSSIBLE LAPAROTOMY (Bilateral) as a surgical intervention.  The patient's history has been reviewed, patient examined, no change in status, stable for surgery.  I have reviewed the patient's chart and labs.  Questions were answered to the patient's satisfaction.     Lafonda Mosses

## 2021-06-14 NOTE — Op Note (Signed)
OPERATIVE NOTE  Pre-operative Diagnosis: Complex bilateral adnexal masses, elevated CA-125 and HE4, simple endometrial hyperplasia  Post-operative Diagnosis: same, benign ovarian cysts bilaterally, distortion of left pelvic retroperitoneum due to large adnexal mass  Operation: Robotic-assisted laparoscopic total hysterectomy with bilateral salpingo-oophorectomy, lysis of adhesions for approximately 30 minutes, left ureterolysis, cystoscopy Modifier 22: obesity requiring additional OR personnel for positioning and retraction. Obesity made retroperitoneal visualization limited and increased the complexity of the case and necessitated additional instrumentation for retraction. Obesity related complexity increased the duration of the procedure by 25 minutes.   Surgeon: Jeral Pinch MD  Assistant Surgeon: Lahoma Crocker MD (an MD assistant was necessary for tissue manipulation, management of robotic instrumentation, retraction and positioning due to the complexity of the case and hospital policies).   Anesthesia: GET  Urine Output: 150cc  Operative Findings: On EUA, small mobile uterus. Fullness appreciated in the upper cul de sac, somewhat difficult to feel either on bimanual or rectovaginal exam. On intra-abdominal entry, normal upper abdominal survey. Normal omentum, small and large bowel. Uterus 6cm and normal appearing. Right ovary 4cm, multicystic with small small/vesicular appearing cysts. Left ovary replaced by 12-14cm large, multicystic mass, adherent to the left sidewall. Cyst not retroperitonealized, but given it's size, significant distortion of the retroperitoneum was noted. During manipulation of left cystic mass, there was unavoidable drainage of several smaller cystic compartments with drainage of clear fluid. Pelvic peritoneum (sidewalls, bladder, uterus) with some small peritoneal implants, biopsy taken and sent for frozen section - benign findings. Bilateral ovaries with  benign cysts on frozen section. No ascites. No adenopathy.   On cystoscopy, bladder dome intact. Strongly efflux noted from bilateral ureteral orifices. Papillary area near right ureteral orifice. Pictures taken for review by urology.  Estimated Blood Loss:  75cc      Total IV Fluids: see I&O flowsheet         Specimens: uterus, cervix, bilateral tubes and ovaries, pelvic washings, peritoneal implant         Complications:  None apparent; patient tolerated the procedure well.         Disposition: PACU - hemodynamically stable.  Procedure Details  The patient was seen in the Holding Room. The risks, benefits, complications, treatment options, and expected outcomes were discussed with the patient.  The patient concurred with the proposed plan, giving informed consent.  The site of surgery properly noted/marked. The patient was identified as Abigail Glover and the procedure verified as a Robotic-assisted hysterectomy with bilateral salpingo oophorectomy, possible staging, any other indicated procedures.   After induction of anesthesia, the patient was draped and prepped in the usual sterile manner. Patient was placed in supine position after anesthesia and draped and prepped in the usual sterile manner as follows: Her arms were tucked to her side with all appropriate precautions.  The shoulders were stabilized with padded shoulder blocks applied to the acromium processes.  The patient was placed in the semi-lithotomy position in [place].  The perineum and vagina were prepped with CholoraPrep. The patient was draped after the CholoraPrep had been allowed to dry for 3 minutes.  A Time Out was held and the above information confirmed.  The urethra was prepped with Betadine. Foley catheter was placed.  A sterile speculum was placed in the vagina.  The cervix was grasped with a single-tooth tenaculum. The cervix was dilated with Kennon Rounds dilators.  The 3.0 Delineator uterine manipulator with a  colpotomizer ring was placed without difficulty.  A pneum occluder balloon was placed  over the manipulator.  OG tube placement was confirmed and to suction.   Next, a 10 mm skin incision was made 1 cm below the subcostal margin in the midclavicular line.  The 5 mm Optiview port and scope was used for direct entry.  Opening pressure was under 10 mm CO2.  The abdomen was insufflated and the findings were noted as above.   At this point and all points during the procedure, the patient's intra-abdominal pressure did not exceed 15 mmHg. Next, an 8 mm skin incision was made superior to the umbilicus and a right and left port were placed about 8 cm lateral to the robot port on the right and left side.  A fourth arm was placed on the right.  The 5 mm assist trocar was exchanged for a 10-12 mm port. All ports were placed under direct visualization.  The patient was placed in steep Trendelenburg.  Bowel was folded away into the upper abdomen.  The robot was docked in the normal manner.  Pelvic washings were collected. Given some peritoneal implants, biopsy of bladder peritoneum was taken to rule out peritoneal metastatic disease.   Attention was first turned to the left. The left peritoneum was opened parallel to the IP ligament to open the retroperitoneal space. The round ligament was transected. The ureter was noted to be on the medial leaf of the broad ligament.  The peritoneum above the ureter was incised and stretched and the infundibulopelvic ligament was skeletonized, cauterized and cut.  With traction intermittently on the tube, intermittently on the IP ligament, the ovarian mass with manipulated medially and superiorly to allow for blunt dissection to free the mass from the retroperitoneum. The fallopian tube and the utero-ovarian ligament were isolated and cauterized on the left, then transected. During manipulation of the mass laterally and anteriorly, there was rupture of several of the small cystic  components with clear fluid noted. Ultimately, the mass was freed inferiorly from the cul de sac and medially, the mass abutted the distal uterine artery, which was skeletonized, cauterized and cut. Once completely free, the left adnexa was placed in an Endocatch bag. It was brought out through the assist trocar after contained decompression was performed. The left adnexa was sent for frozen section.   Attention was then turned to the right. The right peritoneum was opened parallel to the IP ligament to open the retroperitoneal space. The round ligament was transected. The ureter was noted to be on the medial leaf of the broad ligament.  The peritoneum above the ureter was incised and stretched and the infundibulopelvic ligament was skeletonized, cauterized and cut. The fallopian tube and utero-ovarian ligaments were isolated, cauterized and transected, freeing the left adnexa which was placed in an Endocatch bag, removed through the assist trocar, and sent for frozen section.   The posterior peritoneum was taken down to the level of the KOH ring.  The anterior peritoneum was also taken down.  The bladder flap was created to the level of the KOH ring.  The uterine artery on the right side was skeletonized, cauterized and cut in the normal manner.  A similar procedure was performed on the left.  The colpotomy was made and the uterus, cervix, bilateral ovaries and tubes were amputated and delivered through the vagina.  Pedicles were inspected and excellent hemostasis was achieved.    Given distortion of the left retroperitoneum, attention was turned back towards identification of the ureter. Once the ureter was identified at the pelvic brim, ureterolysis  was performed until the ureter was noted to cross inferior to the uterine artery and away from the surgical field.   The colpotomy at the vaginal cuff was closed with Vicryl on a CT1 needle in running manner.  Irrigation was used and excellent hemostasis was  achieved.  At this point in the procedure was completed.  Robotic instruments were removed under direct visulaization.  The robot was undocked. The fascia at the 10-12 mm port was closed with 0 Vicryl on a UR-5 needle.  The subcuticular tissue was closed with 4-0 Vicryl and the skin was closed with 4-0 Monocryl in a subcuticular manner.  Dermabond was applied.    The vagina was swabbed with  minimal bleeding noted. Foley catheter was removed. Cystoscopy was performed with findings noted above.  All sponge, lap and needle counts were correct x  3.   The patient was transferred to the recovery room in stable condition.  Jeral Pinch, MD

## 2021-06-14 NOTE — Anesthesia Preprocedure Evaluation (Addendum)
Anesthesia Evaluation  Patient identified by MRN, date of birth, ID band Patient awake    Reviewed: Allergy & Precautions, NPO status , Patient's Chart, lab work & pertinent test results  Airway Mallampati: III  TM Distance: >3 FB Neck ROM: Full  Mouth opening: Limited Mouth Opening  Dental no notable dental hx.    Pulmonary former smoker,    Pulmonary exam normal breath sounds clear to auscultation       Cardiovascular hypertension, Pt. on medications Normal cardiovascular exam Rhythm:Regular Rate:Normal     Neuro/Psych Anxiety  Neuromuscular disease (trigeminal neuralgia)    GI/Hepatic GERD  ,  Endo/Other  diabetes (prediabetes)hyperlipidemia  Renal/GU CRF and Renal InsufficiencyRenal disease  negative genitourinary   Musculoskeletal  (+) Arthritis , Osteoarthritis,    Abdominal Normal abdominal exam  (+)   Peds negative pediatric ROS (+)  Hematology  (+) anemia ,   Anesthesia Other Findings   Reproductive/Obstetrics                           Anesthesia Physical Anesthesia Plan  ASA: 3  Anesthesia Plan: General   Post-op Pain Management:    Induction: Intravenous  PONV Risk Score and Plan: 3 and Treatment may vary due to age or medical condition, Ondansetron, Dexamethasone and Midazolam  Airway Management Planned: Oral ETT  Additional Equipment: None  Intra-op Plan:   Post-operative Plan: Extubation in OR  Informed Consent: I have reviewed the patients History and Physical, chart, labs and discussed the procedure including the risks, benefits and alternatives for the proposed anesthesia with the patient or authorized representative who has indicated his/her understanding and acceptance.     Dental advisory given  Plan Discussed with: CRNA, Anesthesiologist and Surgeon  Anesthesia Plan Comments: (GETA. Glidescope available. )       Anesthesia Quick Evaluation

## 2021-06-14 NOTE — Anesthesia Postprocedure Evaluation (Signed)
Anesthesia Post Note  Patient: Abigail Glover  Procedure(s) Performed: XI ROBOTIC ASSISTED TOTAL HYSTERECTOMY WITH BILATERAL SALPINGO OOPHORECTOMY, CYSTOSCOPY (Bilateral)     Patient location during evaluation: PACU Anesthesia Type: General Level of consciousness: awake Pain management: pain level controlled Vital Signs Assessment: post-procedure vital signs reviewed and stable Respiratory status: spontaneous breathing and respiratory function stable Cardiovascular status: stable Postop Assessment: no apparent nausea or vomiting Anesthetic complications: no   No notable events documented.  Last Vitals:  Vitals:   06/14/21 1500 06/14/21 1515  BP: 135/64 (!) 141/74  Pulse: 67 69  Resp: 18 14  Temp:  36.7 C  SpO2: 92% 93%    Last Pain:  Vitals:   06/14/21 1515  TempSrc:   PainSc: 3                  Candra R Yordan Martindale

## 2021-06-14 NOTE — Brief Op Note (Signed)
06/14/2021  3:13 PM  PATIENT:  Abigail Glover  75 y.o. female  PRE-OPERATIVE DIAGNOSIS:  OVARIAN MASS ELEVATED TUMOR MARKERS  POST-OPERATIVE DIAGNOSIS:  OVARIAN MASS ELEVATED TUMOR MARKERS  PROCEDURE:  Procedure(s): XI ROBOTIC ASSISTED TOTAL HYSTERECTOMY WITH BILATERAL SALPINGO OOPHORECTOMY, CYSTOSCOPY (Bilateral)  SURGEON:  Surgeon(s) and Role:    * Lafonda Mosses, MD - Primary    * Lahoma Crocker, MD - Assisting  EBL:  75 mL   BLOOD ADMINISTERED:none  DRAINS: none   LOCAL MEDICATIONS USED:  MARCAINE     SPECIMEN:  uterus, cervix, bilateral tubes and ovaries, washings  DISPOSITION OF SPECIMEN:  PATHOLOGY  COUNTS:  YES  TOURNIQUET:  * No tourniquets in log *  DICTATION: .Note written in EPIC  PLAN OF CARE: Discharge to home after PACU  PATIENT DISPOSITION:  PACU - hemodynamically stable.   Delay start of Pharmacological VTE agent (>24hrs) due to surgical blood loss or risk of bleeding: not applicable

## 2021-06-15 ENCOUNTER — Telehealth: Payer: Self-pay

## 2021-06-15 ENCOUNTER — Encounter (HOSPITAL_COMMUNITY): Payer: Self-pay | Admitting: Gynecologic Oncology

## 2021-06-15 NOTE — Telephone Encounter (Signed)
Spoke with Ms. Oser this morning. She states she is eating, drinking and urinating well. She has not had a BM yet but is passing gas. She is taking miralax and fiber as she usually does. Encouraged her to drink plenty of water. She denies fever or chills. Incisions are dry and intact. Her pain is controlled with  tylenol and hydrocodone. She rates her pain as 6/10, pain comes and goes. She reports being comfortable. She reports headache last night but has resolved today.  Instructed to call office with any fever, chills, purulent drainage, uncontrolled pain or any other questions or concerns. Patient verbalizes understanding.   Pt aware of post op appointments as well as the office number [phone] and after hours number [phone] to call if she has any questions or concerns

## 2021-06-16 LAB — SURGICAL PATHOLOGY

## 2021-06-16 LAB — CYTOLOGY - NON PAP

## 2021-06-20 ENCOUNTER — Encounter: Payer: Self-pay | Admitting: Gynecologic Oncology

## 2021-06-20 ENCOUNTER — Ambulatory Visit (HOSPITAL_BASED_OUTPATIENT_CLINIC_OR_DEPARTMENT_OTHER): Payer: Medicare HMO | Admitting: Gynecologic Oncology

## 2021-06-20 DIAGNOSIS — Z90710 Acquired absence of both cervix and uterus: Secondary | ICD-10-CM

## 2021-06-20 DIAGNOSIS — N838 Other noninflammatory disorders of ovary, fallopian tube and broad ligament: Secondary | ICD-10-CM

## 2021-06-20 DIAGNOSIS — Z90722 Acquired absence of ovaries, bilateral: Secondary | ICD-10-CM

## 2021-06-20 DIAGNOSIS — N8501 Benign endometrial hyperplasia: Secondary | ICD-10-CM

## 2021-06-20 DIAGNOSIS — D271 Benign neoplasm of left ovary: Secondary | ICD-10-CM

## 2021-06-20 DIAGNOSIS — D270 Benign neoplasm of right ovary: Secondary | ICD-10-CM

## 2021-06-20 NOTE — Progress Notes (Signed)
Gynecologic Oncology Telehealth Consult Note: Gyn-Onc  I connected with Abigail Glover on 06/20/21 at  4:30 PM EST by telephone and verified that I am speaking with the correct person using two identifiers.  I discussed the limitations, risks, security and privacy concerns of performing an evaluation and management service by telemedicine and the availability of in-person appointments. I also discussed with the patient that there may be a patient responsible charge related to this service. The patient expressed understanding and agreed to proceed.  Other persons participating in the visit and their role in the encounter: None.  Patient's location: Home Provider's location: [facility]  Reason for Visit: Follow-up after surgery  Treatment History: 12/13: Underwent surgery for complex bilateral adnexal masses and elevated tumor markers as well as simple endometrial hyperplasia.  Surgery included robotic total hysterectomy with BSO, lysis of adhesion, left ureterolysis, cystoscopy.  Final pathology revealed benign ovarian cyst, no hyperplasia or malignancy of the endometrium, benign endometrial polyp.  Interval History: Patient reports overall doing well.  She had a hard day yesterday but feels somewhat better today.  Pain is controlled.  She had a bowel movement on Thursday, but this is of been her only bowel movement since surgery.  Has continued her normal routine from before surgery of at tablespoon of MiraLAX and fiber once a day.  Denies any urinary symptoms.  Has had 2 episodes of very scant vaginal spotting since surgery.  Appetite decreased but tolerating p.o. without nausea or emesis.  Past Medical/Surgical History: Past Medical History:  Diagnosis Date   Anemia    Anxiety    Arthritis    Cystocele with prolapse    GERD (gastroesophageal reflux disease)    HLD (hyperlipidemia)    Hypertension    IBS (irritable bowel syndrome)    Pneumonia    Pre-diabetes     Stage 3b chronic kidney disease (CKD) (HCC)    Trigeminal neuralgia of left side of face     Past Surgical History:  Procedure Laterality Date   CHOLECYSTECTOMY, LAPAROSCOPIC     ROBOTIC ASSISTED TOTAL HYSTERECTOMY WITH BILATERAL SALPINGO OOPHERECTOMY Bilateral 06/14/2021   Procedure: XI ROBOTIC ASSISTED TOTAL HYSTERECTOMY WITH BILATERAL SALPINGO OOPHORECTOMY, CYSTOSCOPY;  Surgeon: Lafonda Mosses, MD;  Location: [facility];  Service: Gynecology;  Laterality: Bilateral;   TUBAL LIGATION      History reviewed. No pertinent family history.  Social History   Socioeconomic History   Marital status: Single    Spouse name: Not on file   Number of children: 1   Years of education: 12   Highest education level: Not on file  Occupational History   Occupation: Scientist, water quality  Tobacco Use   Smoking status: Former   Smokeless tobacco: Never  Scientific laboratory technician Use: Never used  Substance and Sexual Activity   Alcohol use: No   Drug use: No   Sexual activity: Not Currently    Birth control/protection: None, Post-menopausal  Other Topics Concern   Not on file  Social History Narrative   Lives alone in a one story home.  Has 1 dog and 1 daughter.  Works as Scientist, water quality at [employer].  Education: high school.   Left Handed    Social Determinants of Health   Financial Resource Strain: Not on file  Food Insecurity: Not on file  Transportation Needs: Not on file  Physical Activity: Not on file  Stress: Not on file  Social Connections: Not on file  Current Medications:  Current Outpatient Medications:    b complex vitamins capsule, Take 1 capsule by mouth daily., Disp: , Rfl:    cholecalciferol (VITAMIN D) 25 MCG (1000 UNIT) tablet, Take 1,000 Units by mouth daily., Disp: , Rfl:    dicyclomine (BENTYL) 10 MG capsule, Take 10 mg by mouth 3 (three) times daily as needed for spasms., Disp: , Rfl:    gabapentin (NEURONTIN) 600 MG tablet, Take 0.5 tablet - 1 tablet daily (Patient  taking differently: Take 600 mg by mouth daily.), Disp: 90 tablet, Rfl: 3   hydrALAZINE (APRESOLINE) 25 MG tablet, Take 25 mg by mouth 3 (three) times daily., Disp: , Rfl:    HYDROcodone-acetaminophen (NORCO/VICODIN) 5-325 MG tablet, Take 1 tablet by mouth in the morning, at noon, and at bedtime., Disp: , Rfl:    olmesartan (BENICAR) 40 MG tablet, Take 40 mg by mouth daily., Disp: , Rfl:    pantoprazole (PROTONIX) 40 MG tablet, Take 40 mg by mouth daily., Disp: , Rfl:    rosuvastatin (CRESTOR) 5 MG tablet, Take 5 mg by mouth daily., Disp: , Rfl:    zolpidem (AMBIEN) 10 MG tablet, Take 10 mg by mouth at bedtime., Disp: , Rfl:   Review of Symptoms: Pertinent positives as per HPI.  Physical Exam: There were no vitals taken for this visit. Deferred given limitations of phone visit.  Laboratory & Radiologic Studies: A. PERITONEAL IMPLANT:  - Peritoneum with focal fibrosis and calcifications.  - No evidence of malignancy.  - See comment.   B. OVARY, LEFT, OOPHORECTOMY:  - Serous cystadenofibroma with calcifications.  - No borderline change or malignancy.   C. UTERUS, CERVIX, RIGHT OVARY AND TUBE:    - Cervix      Nabothian cyst.      No dysplasia or malignancy.  - Endometrium      Benign endometrial polyp.      Inactive endometrium.      No hyperplasia or malignancy.  - Myometrium      Adenomyosis.      No evidence of malignancy.  - Right ovary      Serous cystadenofibroma with calcifications.      No borderline change or malignancy.  - Right Fallopian tube      Hemosalpinx.      No endometriosis or malignancy.   Assessment & Plan: Abigail Glover is a 75 y.o. woman who is approximately 1 week status post robotic surgery for bilateral complex adnexal masses and elevated tumor markers with benign final pathology.  Patient is meeting most postoperative milestones.  We discussed increasing bowel regimen given constipation.  I suspect that this is also resulting in her decreased  appetite.  I have asked her to call the clinic to give me an update later this week after she has added to her bowel regimen.  Discussed her pathology in detail.  I had released results by MyChart, which her daughter had seen last week.  She is very happy with this news.  I discussed the assessment and treatment plan with the patient. The patient was provided with an opportunity to ask questions and all were answered. The patient agreed with the plan and demonstrated an understanding of the instructions.   The patient was advised to call back or see an in-person evaluation if the symptoms worsen or if the condition fails to improve as anticipated.   18 over the phone minutes of total time was spent for this patient encounter, including preparation, face-to-face counseling with  the patient and coordination of care, and documentation of the encounter.   Jeral Pinch, MD  Division of Gynecologic Oncology  Department of Obstetrics and Gynecology  [facility]

## 2021-06-21 ENCOUNTER — Telehealth: Payer: Medicare HMO | Admitting: Gynecologic Oncology

## 2021-07-11 ENCOUNTER — Encounter: Payer: Self-pay | Admitting: Gynecologic Oncology

## 2021-07-11 ENCOUNTER — Inpatient Hospital Stay: Payer: Medicare HMO | Attending: Gynecologic Oncology | Admitting: Gynecologic Oncology

## 2021-07-11 ENCOUNTER — Other Ambulatory Visit: Payer: Self-pay

## 2021-07-11 VITALS — BP 149/84 | HR 65 | Temp 97.8°F | Resp 18 | Ht 60.0 in | Wt 193.2 lb

## 2021-07-11 DIAGNOSIS — Z90710 Acquired absence of both cervix and uterus: Secondary | ICD-10-CM

## 2021-07-11 DIAGNOSIS — N836 Hematosalpinx: Secondary | ICD-10-CM

## 2021-07-11 DIAGNOSIS — Z90722 Acquired absence of ovaries, bilateral: Secondary | ICD-10-CM

## 2021-07-11 DIAGNOSIS — D271 Benign neoplasm of left ovary: Secondary | ICD-10-CM

## 2021-07-11 DIAGNOSIS — N838 Other noninflammatory disorders of ovary, fallopian tube and broad ligament: Secondary | ICD-10-CM

## 2021-07-11 DIAGNOSIS — N888 Other specified noninflammatory disorders of cervix uteri: Secondary | ICD-10-CM

## 2021-07-11 DIAGNOSIS — D270 Benign neoplasm of right ovary: Secondary | ICD-10-CM

## 2021-07-11 NOTE — Patient Instructions (Signed)
You are healing well after surgery!  Remember, no heavy lifting for 6 weeks after surgery.  Please do not place anything in the vagina for at least 8 weeks after surgery.  I am releasing you back to care with your primary care provider and gynecologist.  Please do not hesitate to call me if you need anything in the future.

## 2021-07-11 NOTE — Progress Notes (Signed)
Gynecologic Oncology Return Clinic Visit  07/11/2021  Reason for Visit: Postoperative follow-up  Treatment History: Robotic total hysterectomy, bilateral salpingo-oophorectomy, lysis of adhesions, left ureterolysis, cystoscopy for complex bilateral adnexal masses, elevated tumor markers, and simple endometrial hyperplasia.  Interval History: Presents today for follow-up.  Notes overall doing well.  Denies any abdominal or pelvic pain.  Had very minimal spotting right after surgery, denies any further vaginal bleeding or discharge.  Bowel function is normal with nightly MiraLAX and fiber use.  Reports voiding freely.  Appetite has been normal, no nausea or emesis.  Past Medical/Surgical History: Past Medical History:  Diagnosis Date   Anemia    Anxiety    Arthritis    Cystocele with prolapse    GERD (gastroesophageal reflux disease)    HLD (hyperlipidemia)    Hypertension    IBS (irritable bowel syndrome)    Pneumonia    Pre-diabetes    Stage 3b chronic kidney disease (CKD) (HCC)    Trigeminal neuralgia of left side of face     Past Surgical History:  Procedure Laterality Date   CHOLECYSTECTOMY, LAPAROSCOPIC     ROBOTIC ASSISTED TOTAL HYSTERECTOMY WITH BILATERAL SALPINGO OOPHERECTOMY Bilateral 06/14/2021   Procedure: XI ROBOTIC ASSISTED TOTAL HYSTERECTOMY WITH BILATERAL SALPINGO OOPHORECTOMY, CYSTOSCOPY;  Surgeon: Lafonda Mosses, MD;  Location: [facility];  Service: Gynecology;  Laterality: Bilateral;   TUBAL LIGATION      History reviewed. No pertinent family history.  Social History   Socioeconomic History   Marital status: Single    Spouse name: Not on file   Number of children: 1   Years of education: 12   Highest education level: Not on file  Occupational History   Occupation: Scientist, water quality  Tobacco Use   Smoking status: Former   Smokeless tobacco: Never  Scientific laboratory technician Use: Never used  Substance and Sexual Activity   Alcohol use: No   Drug use: No   Sexual  activity: Not Currently    Birth control/protection: None, Post-menopausal  Other Topics Concern   Not on file  Social History Narrative   Lives alone in a one story home.  Has 1 dog and 1 daughter.  Works as Scientist, water quality at [employer].  Education: high school.   Left Handed    Social Determinants of Health   Financial Resource Strain: Not on file  Food Insecurity: Not on file  Transportation Needs: Not on file  Physical Activity: Not on file  Stress: Not on file  Social Connections: Not on file    Current Medications:  Current Outpatient Medications:    b complex vitamins capsule, Take 1 capsule by mouth daily., Disp: , Rfl:    cholecalciferol (VITAMIN D) 25 MCG (1000 UNIT) tablet, Take 1,000 Units by mouth daily., Disp: , Rfl:    dicyclomine (BENTYL) 10 MG capsule, Take 10 mg by mouth 3 (three) times daily as needed for spasms., Disp: , Rfl:    gabapentin (NEURONTIN) 600 MG tablet, Take 0.5 tablet - 1 tablet daily (Patient taking differently: Take 600 mg by mouth daily.), Disp: 90 tablet, Rfl: 3   hydrALAZINE (APRESOLINE) 25 MG tablet, Take 25 mg by mouth 3 (three) times daily., Disp: , Rfl:    HYDROcodone-acetaminophen (NORCO/VICODIN) 5-325 MG tablet, Take 1 tablet by mouth in the morning, at noon, and at bedtime., Disp: , Rfl:    olmesartan (BENICAR) 40 MG tablet, Take 40 mg by mouth daily., Disp: , Rfl:    pantoprazole (PROTONIX) 40  MG tablet, Take 40 mg by mouth daily., Disp: , Rfl:    rosuvastatin (CRESTOR) 5 MG tablet, Take 5 mg by mouth daily., Disp: , Rfl:    zolpidem (AMBIEN) 10 MG tablet, Take 10 mg by mouth at bedtime., Disp: , Rfl:   Review of Systems: Denies appetite changes, fevers, chills, fatigue, unexplained weight changes. Denies hearing loss, neck lumps or masses, mouth sores, ringing in ears or voice changes. Denies cough or wheezing.  Denies shortness of breath. Denies chest pain or palpitations. Denies leg swelling. Denies abdominal distention,  pain, blood in stools, constipation, diarrhea, nausea, vomiting, or early satiety. Denies pain with intercourse, dysuria, frequency, hematuria or incontinence. Denies hot flashes, pelvic pain, vaginal bleeding or vaginal discharge.   Denies joint pain, back pain or muscle pain/cramps. Denies itching, rash, or wounds. Denies dizziness, headaches, numbness or seizures. Denies swollen lymph nodes or glands, denies easy bruising or bleeding. Denies anxiety, depression, confusion, or decreased concentration.  Physical Exam: BP (!) 149/84 (BP Location: Left Arm, Patient Position: Sitting)    Pulse 65    Temp 97.8 F (36.6 C) (Tympanic)    Resp 18    Ht 5' (1.524 m)    Wt 193 lb 3.2 oz (87.6 kg)    SpO2 99%    BMI 37.73 kg/m  General: Alert, oriented, no acute distress. HEENT: Normocephalic, atraumatic, sclera anicteric. Chest: Unlabored breathing on room air. Abdomen: Obese, soft, nontender.  Normoactive bowel sounds.  No masses or hepatosplenomegaly appreciated.  Well-healed incisions, remaining Dermabond removed. Extremities: Grossly normal range of motion.  Warm, well perfused.  No edema bilaterally.  GU: Normal appearing external genitalia without erythema, excoriation, or lesions.  Speculum exam reveals cuff intact, suture visible, no bleeding or discharge.  Bimanual exam reveals cuff intact, no fluctuance or tenderness with palpation.   Laboratory & Radiologic Studies: Pelvic washings with no malignant cells identified.  A. PERITONEAL IMPLANT:  - Peritoneum with focal fibrosis and calcifications.  - No evidence of malignancy.  - See comment.   B. OVARY, LEFT, OOPHORECTOMY:  - Serous cystadenofibroma with calcifications.  - No borderline change or malignancy.   C. UTERUS, CERVIX, RIGHT OVARY AND TUBE:    - Cervix      Nabothian cyst.      No dysplasia or malignancy.  - Endometrium      Benign endometrial polyp.      Inactive endometrium.      No hyperplasia or malignancy.  -  Myometrium      Adenomyosis.      No evidence of malignancy.  - Right ovary      Serous cystadenofibroma with calcifications.      No borderline change or malignancy.  - Right Fallopian tube      Hemosalpinx.      No endometriosis or malignancy.   COMMENT:  A. The peritoneal implant specimen shows focal fibrosis with  calcifications and the likely represents a calcified focus of  endosalpingosis.   Assessment & Plan: Abigail Glover is a 76 y.o. woman who is several weeks status post robotic hysterectomy and BSO for complex adnexal mass and history of simple endometrial hyperplasia.  Patient is overall doing very well and meeting all milestones.  Discussed continued postoperative restrictions as well as expectations.  She asked today about going back to work early.  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  Reviewed final pathology again.  Patient given a copy of her pathology results.  I am releasing her back to her PCP and gynecologist.  She is aware she can call me with any needs or questions in the future.  22 minutes of total time was spent for this patient encounter, including preparation, face-to-face counseling with the patient and coordination of care, and documentation of the encounter.  Jeral Pinch, MD  Division of Gynecologic Oncology  Department of Obstetrics and Gynecology  [facility]

## 2021-07-12 DIAGNOSIS — M722 Plantar fascial fibromatosis: Secondary | ICD-10-CM | POA: Diagnosis not present

## 2021-07-12 DIAGNOSIS — G500 Trigeminal neuralgia: Secondary | ICD-10-CM | POA: Diagnosis not present

## 2021-07-12 DIAGNOSIS — M1990 Unspecified osteoarthritis, unspecified site: Secondary | ICD-10-CM | POA: Diagnosis not present

## 2021-07-12 DIAGNOSIS — G894 Chronic pain syndrome: Secondary | ICD-10-CM | POA: Diagnosis not present

## 2021-07-12 DIAGNOSIS — Z1389 Encounter for screening for other disorder: Secondary | ICD-10-CM | POA: Diagnosis not present

## 2021-07-12 DIAGNOSIS — Z79891 Long term (current) use of opiate analgesic: Secondary | ICD-10-CM | POA: Diagnosis not present

## 2021-07-26 DIAGNOSIS — Z6836 Body mass index (BMI) 36.0-36.9, adult: Secondary | ICD-10-CM | POA: Diagnosis not present

## 2021-07-26 DIAGNOSIS — R1013 Epigastric pain: Secondary | ICD-10-CM | POA: Diagnosis not present

## 2021-07-27 ENCOUNTER — Telehealth: Payer: Self-pay | Admitting: Neurology

## 2021-07-27 NOTE — Telephone Encounter (Signed)
Patient said its a new year, she wants to make sure she is able to get her gabapentin. Posey Pronto writes a Quarry manager or calls her ins company to get price reduced. Still has [employer]

## 2021-07-28 ENCOUNTER — Telehealth: Payer: Self-pay

## 2021-07-28 NOTE — Telephone Encounter (Addendum)
New message - [employer]   Bess Snowden Key: [id number] help? Call us at [phone]  Outcome Additional Information Required  The patient currently has access to the requested medication and a Prior Authorization is not needed for the patient/medication.  Drug Gabapentin 600MG  tablets  Form [employer] Electronic PA Form [phone] NCPDP)

## 2021-07-28 NOTE — Telephone Encounter (Signed)
F/u  Ardice Blanchette Key: [id number] help? Call us at [phone]  Outcome Additional Information Required  The patient currently has access to the requested medication and a Prior Authorization is not needed for the patient/medication.  Drug Gabapentin 600MG  tablets  Form [employer] Electronic PA Form [phone] NCPDP)

## 2021-08-09 DIAGNOSIS — Z1389 Encounter for screening for other disorder: Secondary | ICD-10-CM | POA: Diagnosis not present

## 2021-08-09 DIAGNOSIS — G500 Trigeminal neuralgia: Secondary | ICD-10-CM | POA: Diagnosis not present

## 2021-08-09 DIAGNOSIS — M722 Plantar fascial fibromatosis: Secondary | ICD-10-CM | POA: Diagnosis not present

## 2021-08-09 DIAGNOSIS — Z79891 Long term (current) use of opiate analgesic: Secondary | ICD-10-CM | POA: Diagnosis not present

## 2021-08-09 DIAGNOSIS — M1990 Unspecified osteoarthritis, unspecified site: Secondary | ICD-10-CM | POA: Diagnosis not present

## 2021-08-09 DIAGNOSIS — G894 Chronic pain syndrome: Secondary | ICD-10-CM | POA: Diagnosis not present

## 2021-08-22 ENCOUNTER — Telehealth: Payer: Self-pay

## 2021-08-22 ENCOUNTER — Telehealth: Payer: Self-pay | Admitting: Neurology

## 2021-08-22 NOTE — Telephone Encounter (Signed)
New message -- [place] Key: [id number] help? Call us at [phone] Outcome Additional Information Required Patient not eligible (does not have coverage with the payer) Drug Gabapentin 600MG  tablets Form [facility] [employer] Medicare Electronic Prior Authorization Form 2017 NCPDP

## 2021-08-22 NOTE — Telephone Encounter (Addendum)
F/u -- [employer] Team [place] Key: [id number] help? Call us at [phone] Outcome Additional Information Required Patient not eligible (does not have coverage with the payer) Drug Gabapentin 600MG  tablets Form [employer] [employer] Medicare Electronic Prior Authorization Form 2017 NCPDP

## 2021-08-22 NOTE — Telephone Encounter (Signed)
F/u -- [place] Key: [id number] help? Call us at [phone] Outcome Additional Information Required Patient not eligible (does not have coverage with the payer) Drug Gabapentin 600MG  tablets Form [employer] Medicare Electronic Prior Authorization Form 2017 NCPDP

## 2021-08-22 NOTE — Telephone Encounter (Signed)
F/u - under [employer] Notation made on 07/28/2021  Outcome Additional Information Required The patient currently has access to the requested medication and a Prior Authorization is not needed for the patient/medication.  Sophiya Pettitt Key: [id number] help? Call us at [phone]  Drug Gabapentin 600MG  tablets  Form [employer] Electronic PA Form [phone] NCPDP)

## 2021-08-22 NOTE — Telephone Encounter (Signed)
Calling about prior authorization  for the gabapentin that she needs every year.  She takes 1 tablet daily.

## 2021-08-25 DIAGNOSIS — R7303 Prediabetes: Secondary | ICD-10-CM | POA: Diagnosis not present

## 2021-08-25 DIAGNOSIS — Z79899 Other long term (current) drug therapy: Secondary | ICD-10-CM | POA: Diagnosis not present

## 2021-08-25 DIAGNOSIS — Z2821 Immunization not carried out because of patient refusal: Secondary | ICD-10-CM | POA: Diagnosis not present

## 2021-08-25 DIAGNOSIS — N1831 Chronic kidney disease, stage 3a: Secondary | ICD-10-CM | POA: Diagnosis not present

## 2021-08-25 DIAGNOSIS — Z6836 Body mass index (BMI) 36.0-36.9, adult: Secondary | ICD-10-CM | POA: Diagnosis not present

## 2021-08-25 DIAGNOSIS — E782 Mixed hyperlipidemia: Secondary | ICD-10-CM | POA: Diagnosis not present

## 2021-08-25 DIAGNOSIS — N2581 Secondary hyperparathyroidism of renal origin: Secondary | ICD-10-CM | POA: Diagnosis not present

## 2021-08-25 DIAGNOSIS — R69 Illness, unspecified: Secondary | ICD-10-CM | POA: Diagnosis not present

## 2021-08-29 DIAGNOSIS — L814 Other melanin hyperpigmentation: Secondary | ICD-10-CM | POA: Diagnosis not present

## 2021-08-29 DIAGNOSIS — L820 Inflamed seborrheic keratosis: Secondary | ICD-10-CM | POA: Diagnosis not present

## 2021-09-01 ENCOUNTER — Telehealth: Payer: Self-pay | Admitting: Neurology

## 2021-09-01 NOTE — Telephone Encounter (Signed)
August 22, 2021 Me   GD   12:12 PM Note New message -- [place] Key: [id number] help? Call us at [phone] Outcome Additional Information Required Patient not eligible (does not have coverage with the payer) Drug Gabapentin 600MG  tablets Form [facility] [employer] Medicare Electronic Prior Authorization Form 2017 NCPDP       Carlita Streety Key: BGUD3MPENeed help? Call us at [phone] Outcome Additional Information Required Patient not eligible (does not have coverage with the payer) Drug Gabapentin 600MG  tablets Form [employer] [employer] Medicare Electronic Prior Authorization Form 2017 NCPDP

## 2021-09-01 NOTE — Telephone Encounter (Signed)
Patient  would like to follow up on her medication gabapentin. Its still high when she went to pharmacy to pick up. Wants to know if patel got the tier moved ?

## 2021-09-06 DIAGNOSIS — G500 Trigeminal neuralgia: Secondary | ICD-10-CM | POA: Diagnosis not present

## 2021-09-06 DIAGNOSIS — Z79891 Long term (current) use of opiate analgesic: Secondary | ICD-10-CM | POA: Diagnosis not present

## 2021-09-06 DIAGNOSIS — G894 Chronic pain syndrome: Secondary | ICD-10-CM | POA: Diagnosis not present

## 2021-09-06 DIAGNOSIS — M722 Plantar fascial fibromatosis: Secondary | ICD-10-CM | POA: Diagnosis not present

## 2021-09-06 DIAGNOSIS — Z1389 Encounter for screening for other disorder: Secondary | ICD-10-CM | POA: Diagnosis not present

## 2021-09-16 ENCOUNTER — Telehealth: Payer: Self-pay | Admitting: *Deleted

## 2021-09-16 NOTE — Telephone Encounter (Signed)
Pt called and stated that she is experiencing some vaginal bleeding when she wipes. She stated that yesterday when she wiped herself after using the bathroom there was some blood on the toilet paper. She stated it was a small amount of dull red color. She has not experienced that today and it was that isolated incident. No pain associated with the incident. She did mention that yesterday she had to pick up a carpet cleaner and put it in the dumpster. Educated pt that increase activity or heavy lifting can cause some bleeding post surgery. Educated pt to monitor bleeding if it progresses to being heavier or more frequently, she starts to experience pain or changes in bowel or bladder symptoms to call the office. Provided pt with the after hour office phone number. Pt verbalized understanding and did not voice any other concerns.  ?

## 2021-09-22 ENCOUNTER — Encounter: Payer: Self-pay | Admitting: Neurology

## 2021-09-23 ENCOUNTER — Telehealth: Payer: Self-pay | Admitting: Neurology

## 2021-09-23 DIAGNOSIS — G500 Trigeminal neuralgia: Secondary | ICD-10-CM

## 2021-09-23 MED ORDER — GABAPENTIN 300 MG PO CAPS: ORAL_CAPSULE | ORAL | 3 refills | Status: DC

## 2021-09-23 NOTE — Telephone Encounter (Signed)
We have needed to get PA on her gabapentin in the past.  Please initiate PA for gabapentin '300mg'$  tablets (not '600mg'$  tablets) BID. ? ?

## 2021-09-23 NOTE — Telephone Encounter (Signed)
Patient called to see if her prescription for Gabapentin had been moved to a different tier.  She had called about this a few weeks ago, was just following up.  She will be out of the medication the end of march. ?

## 2021-09-23 NOTE — Telephone Encounter (Signed)
Initiated PA on covermymeds for Gabapentin '300mg'$ .  ? ?The patient currently has access to the requested medication and a Prior Authorization is not needed for the patient/medication ?

## 2021-09-23 NOTE — Telephone Encounter (Signed)
Please let pt know that switched her to gabapentin '300mg'$  tablets, which it looks like her insurance will cover.  New Rx for gabapentin '300mg'$  take 1-2 tablets daily has been sent. ?

## 2021-09-23 NOTE — Telephone Encounter (Signed)
She called back and stated that insurance says to add exception in the cost sharing tier for the gabapentin. ?

## 2021-09-26 NOTE — Telephone Encounter (Signed)
Called patient and left a detailed message per DPR that 300 mg gabapentin was sent into her pharmacy and which her insurance will cover. New Rx for gabapentin '300mg'$  take 1-2 tablets daily has been sent. Left our contact information for patient to give [place] a call if she had any questions or concerns.  ?

## 2021-09-27 ENCOUNTER — Telehealth: Payer: Self-pay | Admitting: Neurology

## 2021-09-27 NOTE — Telephone Encounter (Signed)
Pt called in stating she didn't need a new prescription for the Gabapentin. She stated each year our office has to do an exception with her insurance so she can afford it. It is not a PA. The medication is a higher tier, so an exception needs to be done. ?

## 2021-09-28 DIAGNOSIS — R69 Illness, unspecified: Secondary | ICD-10-CM | POA: Diagnosis not present

## 2021-09-28 DIAGNOSIS — F5101 Primary insomnia: Secondary | ICD-10-CM | POA: Diagnosis not present

## 2021-09-28 NOTE — Telephone Encounter (Signed)
Called Patients insurance and received tier exemption of Tier 1. Valid from 07/03/21-07/02/22. Case#: [id number]. Informed a fax will be coming through.  ? ?Called patient and left a detailed message per DPR to inform her of above.   ?

## 2021-10-12 DIAGNOSIS — Z79891 Long term (current) use of opiate analgesic: Secondary | ICD-10-CM | POA: Diagnosis not present

## 2021-10-12 DIAGNOSIS — G894 Chronic pain syndrome: Secondary | ICD-10-CM | POA: Diagnosis not present

## 2021-10-12 DIAGNOSIS — M722 Plantar fascial fibromatosis: Secondary | ICD-10-CM | POA: Diagnosis not present

## 2021-10-12 DIAGNOSIS — M1990 Unspecified osteoarthritis, unspecified site: Secondary | ICD-10-CM | POA: Diagnosis not present

## 2021-10-12 DIAGNOSIS — G500 Trigeminal neuralgia: Secondary | ICD-10-CM | POA: Diagnosis not present

## 2021-10-12 DIAGNOSIS — Z1389 Encounter for screening for other disorder: Secondary | ICD-10-CM | POA: Diagnosis not present

## 2021-10-24 DIAGNOSIS — D2239 Melanocytic nevi of other parts of face: Secondary | ICD-10-CM | POA: Diagnosis not present

## 2021-10-24 DIAGNOSIS — L719 Rosacea, unspecified: Secondary | ICD-10-CM | POA: Diagnosis not present

## 2021-10-24 DIAGNOSIS — L820 Inflamed seborrheic keratosis: Secondary | ICD-10-CM | POA: Diagnosis not present

## 2021-10-28 ENCOUNTER — Ambulatory Visit: Payer: Medicare HMO | Admitting: Neurology

## 2021-10-28 DIAGNOSIS — S29012A Strain of muscle and tendon of back wall of thorax, initial encounter: Secondary | ICD-10-CM | POA: Diagnosis not present

## 2021-10-28 DIAGNOSIS — I10 Essential (primary) hypertension: Secondary | ICD-10-CM | POA: Diagnosis not present

## 2021-10-28 DIAGNOSIS — N183 Chronic kidney disease, stage 3 unspecified: Secondary | ICD-10-CM | POA: Diagnosis not present

## 2021-11-02 DIAGNOSIS — M62830 Muscle spasm of back: Secondary | ICD-10-CM | POA: Diagnosis not present

## 2021-11-16 DIAGNOSIS — Z79891 Long term (current) use of opiate analgesic: Secondary | ICD-10-CM | POA: Diagnosis not present

## 2021-11-16 DIAGNOSIS — Z1389 Encounter for screening for other disorder: Secondary | ICD-10-CM | POA: Diagnosis not present

## 2021-11-16 DIAGNOSIS — M1990 Unspecified osteoarthritis, unspecified site: Secondary | ICD-10-CM | POA: Diagnosis not present

## 2021-11-16 DIAGNOSIS — G894 Chronic pain syndrome: Secondary | ICD-10-CM | POA: Diagnosis not present

## 2021-11-16 DIAGNOSIS — G500 Trigeminal neuralgia: Secondary | ICD-10-CM | POA: Diagnosis not present

## 2021-11-16 DIAGNOSIS — M722 Plantar fascial fibromatosis: Secondary | ICD-10-CM | POA: Diagnosis not present

## 2021-12-09 ENCOUNTER — Ambulatory Visit: Payer: Medicare HMO | Admitting: Neurology

## 2021-12-14 DIAGNOSIS — G894 Chronic pain syndrome: Secondary | ICD-10-CM | POA: Diagnosis not present

## 2021-12-14 DIAGNOSIS — Z79891 Long term (current) use of opiate analgesic: Secondary | ICD-10-CM | POA: Diagnosis not present

## 2021-12-14 DIAGNOSIS — M1990 Unspecified osteoarthritis, unspecified site: Secondary | ICD-10-CM | POA: Diagnosis not present

## 2021-12-14 DIAGNOSIS — G500 Trigeminal neuralgia: Secondary | ICD-10-CM | POA: Diagnosis not present

## 2021-12-14 DIAGNOSIS — Z1389 Encounter for screening for other disorder: Secondary | ICD-10-CM | POA: Diagnosis not present

## 2021-12-14 DIAGNOSIS — M722 Plantar fascial fibromatosis: Secondary | ICD-10-CM | POA: Diagnosis not present

## 2021-12-15 DIAGNOSIS — N1832 Chronic kidney disease, stage 3b: Secondary | ICD-10-CM | POA: Diagnosis not present

## 2021-12-15 DIAGNOSIS — D631 Anemia in chronic kidney disease: Secondary | ICD-10-CM | POA: Diagnosis not present

## 2021-12-15 DIAGNOSIS — N2581 Secondary hyperparathyroidism of renal origin: Secondary | ICD-10-CM | POA: Diagnosis not present

## 2021-12-15 DIAGNOSIS — I129 Hypertensive chronic kidney disease with stage 1 through stage 4 chronic kidney disease, or unspecified chronic kidney disease: Secondary | ICD-10-CM | POA: Diagnosis not present

## 2021-12-15 DIAGNOSIS — E8722 Chronic metabolic acidosis: Secondary | ICD-10-CM | POA: Diagnosis not present

## 2021-12-21 DIAGNOSIS — N2581 Secondary hyperparathyroidism of renal origin: Secondary | ICD-10-CM | POA: Diagnosis not present

## 2021-12-21 DIAGNOSIS — N1832 Chronic kidney disease, stage 3b: Secondary | ICD-10-CM | POA: Diagnosis not present

## 2021-12-21 DIAGNOSIS — D631 Anemia in chronic kidney disease: Secondary | ICD-10-CM | POA: Diagnosis not present

## 2021-12-21 DIAGNOSIS — R809 Proteinuria, unspecified: Secondary | ICD-10-CM | POA: Diagnosis not present

## 2021-12-21 DIAGNOSIS — I129 Hypertensive chronic kidney disease with stage 1 through stage 4 chronic kidney disease, or unspecified chronic kidney disease: Secondary | ICD-10-CM | POA: Diagnosis not present

## 2021-12-23 DIAGNOSIS — N390 Urinary tract infection, site not specified: Secondary | ICD-10-CM | POA: Diagnosis not present

## 2022-01-13 DIAGNOSIS — G500 Trigeminal neuralgia: Secondary | ICD-10-CM | POA: Diagnosis not present

## 2022-01-13 DIAGNOSIS — G894 Chronic pain syndrome: Secondary | ICD-10-CM | POA: Diagnosis not present

## 2022-01-13 DIAGNOSIS — Z1389 Encounter for screening for other disorder: Secondary | ICD-10-CM | POA: Diagnosis not present

## 2022-01-13 DIAGNOSIS — M722 Plantar fascial fibromatosis: Secondary | ICD-10-CM | POA: Diagnosis not present

## 2022-01-13 DIAGNOSIS — Z79891 Long term (current) use of opiate analgesic: Secondary | ICD-10-CM | POA: Diagnosis not present

## 2022-02-08 DIAGNOSIS — Z1389 Encounter for screening for other disorder: Secondary | ICD-10-CM | POA: Diagnosis not present

## 2022-02-08 DIAGNOSIS — G500 Trigeminal neuralgia: Secondary | ICD-10-CM | POA: Diagnosis not present

## 2022-02-08 DIAGNOSIS — M722 Plantar fascial fibromatosis: Secondary | ICD-10-CM | POA: Diagnosis not present

## 2022-02-08 DIAGNOSIS — G894 Chronic pain syndrome: Secondary | ICD-10-CM | POA: Diagnosis not present

## 2022-02-08 DIAGNOSIS — Z79891 Long term (current) use of opiate analgesic: Secondary | ICD-10-CM | POA: Diagnosis not present

## 2022-02-08 DIAGNOSIS — M1990 Unspecified osteoarthritis, unspecified site: Secondary | ICD-10-CM | POA: Diagnosis not present

## 2022-02-09 NOTE — Progress Notes (Signed)
Follow-up Visit   Date: 02/10/22    ADDELINE Glover MRN: [id number] DOB: [birth year]   Interim History: Abigail Glover is a 76 y.o. left-handed Caucasian female with hypertension returning to the clinic for follow-up of left trigeminal neuralgia.  The patient was accompanied to the clinic by self.  Her trigeminal neuralgia has been under excellent control on gabapentin '300mg'$  daily.  When she has a flare with her pain which is rare, she takes an extra '300mg'$  which helps.    She remains off carbamazepine.   She had nonmalignant ovarian masses which was resected in December 2022.    Medications:  Current Outpatient Medications on File Prior to Visit  Medication Sig Dispense Refill   b complex vitamins capsule Take 1 capsule by mouth daily.     cholecalciferol (VITAMIN D) 25 MCG (1000 UNIT) tablet Take 1,000 Units by mouth daily.     dicyclomine (BENTYL) 10 MG capsule Take 10 mg by mouth 3 (three) times daily as needed for spasms.     Eszopiclone 3 MG TABS Take 3 mg by mouth at bedtime.     gabapentin (NEURONTIN) 300 MG capsule Take 1 -2 tablets daily as needed for pain. 180 capsule 3   hydrALAZINE (APRESOLINE) 25 MG tablet Take 25 mg by mouth 3 (three) times daily.     HYDROcodone-acetaminophen (NORCO/VICODIN) 5-325 MG tablet Take 1 tablet by mouth in the morning, at noon, and at bedtime.     olmesartan (BENICAR) 40 MG tablet Take 40 mg by mouth daily.     pantoprazole (PROTONIX) 40 MG tablet Take 40 mg by mouth daily.     rosuvastatin (CRESTOR) 5 MG tablet Take 5 mg by mouth daily.     zolpidem (AMBIEN) 10 MG tablet Take 10 mg by mouth at bedtime. (Patient not taking: Reported on 02/10/2022)     No current facility-administered medications on file prior to visit.    Allergies:  Allergies  Allergen Reactions   Amitriptyline     Wouldn't let PT go to sleep    Amlodipine     Leg swelling   Citalopram     Pt does not recall reaction    Hydrochlorothiazide W-Triamterene      Per kidney Dr, was taken off med    Metoprolol     Pt does not recall reaction    Penicillin G     Per Patient " I have never actually taken penicillin but I broke out in a rash on the tops of my legs with cephalexin and was told not to take penicillin."   Prednisone     Low doses are ok  - intolerance to higher dosage, affects mental state   Cephalexin Rash    Vital Signs:  BP (!) 142/73   Pulse 95   Resp 18   Ht 5' (1.524 m)   Wt 188 lb (85.3 kg)   SpO2 100%   BMI 36.72 kg/m    Neurological Exam: MENTAL STATUS including orientation to time, place, person, recent and remote memory, attention span and concentration, language, and fund of knowledge is normal.  Speech is not dysarthric.  CRANIAL NERVES:  Pupils equal round and reactive to light.  Normal conjugate, extra-ocular eye movements in all directions of gaze. Mild left ptosis (old). Face is symmetric. Facial sensation intact. Palate elevates symmetrically.  Tongue is midline.  MOTOR:  Motor strength is 5/5 in all extremities.  No pronator drift.  Tone is normal.  COORDINATION/GAIT:    Gait narrow based and stable.   IMPRESSION/PLAN: Left trigeminal neuralgia, diagnosed 2014, controlled on gabapentin.  - Continue gabapentin 300-'600mg'$  daily.   - If symptoms get worse, ok to titrate gabapentin and/or add carbamazepine  Return to clinic in 1 year   Thank you for allowing me to participate in patient's care.  If I can answer any additional questions, I would be pleased to do so.    Sincerely,    Iden Stripling K. Posey Pronto, DO

## 2022-02-10 ENCOUNTER — Encounter: Payer: Self-pay | Admitting: Neurology

## 2022-02-10 ENCOUNTER — Ambulatory Visit: Payer: Medicare HMO | Admitting: Neurology

## 2022-02-10 VITALS — BP 142/73 | HR 95 | Resp 18 | Ht 60.0 in | Wt 188.0 lb

## 2022-02-10 DIAGNOSIS — G500 Trigeminal neuralgia: Secondary | ICD-10-CM | POA: Diagnosis not present

## 2022-02-10 NOTE — Patient Instructions (Signed)
It was great to see you today!  I'll see you back in 1 year

## 2022-02-23 DIAGNOSIS — N2581 Secondary hyperparathyroidism of renal origin: Secondary | ICD-10-CM | POA: Diagnosis not present

## 2022-02-23 DIAGNOSIS — R7303 Prediabetes: Secondary | ICD-10-CM | POA: Diagnosis not present

## 2022-02-23 DIAGNOSIS — D631 Anemia in chronic kidney disease: Secondary | ICD-10-CM | POA: Diagnosis not present

## 2022-02-23 DIAGNOSIS — N1831 Chronic kidney disease, stage 3a: Secondary | ICD-10-CM | POA: Diagnosis not present

## 2022-02-23 DIAGNOSIS — R69 Illness, unspecified: Secondary | ICD-10-CM | POA: Diagnosis not present

## 2022-02-23 DIAGNOSIS — Z6836 Body mass index (BMI) 36.0-36.9, adult: Secondary | ICD-10-CM | POA: Diagnosis not present

## 2022-02-23 DIAGNOSIS — I10 Essential (primary) hypertension: Secondary | ICD-10-CM | POA: Diagnosis not present

## 2022-02-23 DIAGNOSIS — Z139 Encounter for screening, unspecified: Secondary | ICD-10-CM | POA: Diagnosis not present

## 2022-02-23 DIAGNOSIS — E782 Mixed hyperlipidemia: Secondary | ICD-10-CM | POA: Diagnosis not present

## 2022-03-09 ENCOUNTER — Ambulatory Visit: Payer: Self-pay | Admitting: Licensed Clinical Social Worker

## 2022-03-09 NOTE — Patient Outreach (Signed)
  Care Coordination   03/09/2022 Name: Abigail Glover MRN: [id number] DOB: [birth year]   Care Coordination Outreach Attempts:  An unsuccessful telephone outreach was attempted today to offer the patient information about available care coordination services as a benefit of their health plan.   Follow Up Plan:  Additional outreach attempts will be made to offer the patient care coordination information and services.   Encounter Outcome:  No Answer  Care Coordination Interventions Activated:  No   Care Coordination Interventions:  No, not indicated    Lenor Derrick, MSW  Social Worker [facility]  [phone]

## 2022-03-10 DIAGNOSIS — G500 Trigeminal neuralgia: Secondary | ICD-10-CM | POA: Diagnosis not present

## 2022-03-10 DIAGNOSIS — Z79891 Long term (current) use of opiate analgesic: Secondary | ICD-10-CM | POA: Diagnosis not present

## 2022-03-10 DIAGNOSIS — M1990 Unspecified osteoarthritis, unspecified site: Secondary | ICD-10-CM | POA: Diagnosis not present

## 2022-03-10 DIAGNOSIS — Z1389 Encounter for screening for other disorder: Secondary | ICD-10-CM | POA: Diagnosis not present

## 2022-03-10 DIAGNOSIS — G894 Chronic pain syndrome: Secondary | ICD-10-CM | POA: Diagnosis not present

## 2022-03-10 DIAGNOSIS — M722 Plantar fascial fibromatosis: Secondary | ICD-10-CM | POA: Diagnosis not present

## 2022-03-22 ENCOUNTER — Encounter: Payer: Self-pay | Admitting: Neurology

## 2022-04-07 DIAGNOSIS — G894 Chronic pain syndrome: Secondary | ICD-10-CM | POA: Diagnosis not present

## 2022-04-07 DIAGNOSIS — G500 Trigeminal neuralgia: Secondary | ICD-10-CM | POA: Diagnosis not present

## 2022-04-07 DIAGNOSIS — Z79891 Long term (current) use of opiate analgesic: Secondary | ICD-10-CM | POA: Diagnosis not present

## 2022-04-07 DIAGNOSIS — M722 Plantar fascial fibromatosis: Secondary | ICD-10-CM | POA: Diagnosis not present

## 2022-04-07 DIAGNOSIS — Z1389 Encounter for screening for other disorder: Secondary | ICD-10-CM | POA: Diagnosis not present

## 2022-05-05 DIAGNOSIS — G894 Chronic pain syndrome: Secondary | ICD-10-CM | POA: Diagnosis not present

## 2022-05-05 DIAGNOSIS — M1990 Unspecified osteoarthritis, unspecified site: Secondary | ICD-10-CM | POA: Diagnosis not present

## 2022-05-05 DIAGNOSIS — M722 Plantar fascial fibromatosis: Secondary | ICD-10-CM | POA: Diagnosis not present

## 2022-05-05 DIAGNOSIS — G500 Trigeminal neuralgia: Secondary | ICD-10-CM | POA: Diagnosis not present

## 2022-05-05 DIAGNOSIS — Z1389 Encounter for screening for other disorder: Secondary | ICD-10-CM | POA: Diagnosis not present

## 2022-05-05 DIAGNOSIS — Z79891 Long term (current) use of opiate analgesic: Secondary | ICD-10-CM | POA: Diagnosis not present

## 2022-06-05 ENCOUNTER — Telehealth: Payer: Self-pay

## 2022-06-05 NOTE — Patient Outreach (Signed)
  Care Coordination   06/05/2022 Name: Abigail Glover MRN: [id number] DOB: Jan 04, 1946   Care Coordination Outreach Attempts:  A second unsuccessful outreach was attempted today to offer the patient with information about available care coordination services as a benefit of their health plan.     Follow Up Plan:  Additional outreach attempts will be made to offer the patient care coordination information and services.   Encounter Outcome:  No Answer   Care Coordination Interventions:  No, not indicated    Tomasa Rand, RN, BSN, [facility] [employer] [phone]

## 2022-06-06 ENCOUNTER — Telehealth: Payer: Self-pay

## 2022-06-06 NOTE — Patient Outreach (Signed)
  Care Coordination   06/06/2022 Name: Abigail Glover MRN: [id number] DOB: May 28, 1946   Care Coordination Outreach Attempts:  A third unsuccessful outreach was attempted today to offer the patient with information about available care coordination services as a benefit of their health plan.   Follow Up Plan:  No further outreach attempts will be made at this time. We have been unable to contact the patient to offer or enroll patient in care coordination services  Encounter Outcome:  No Answer   Care Coordination Interventions:  No, not indicated    Tomasa Rand, RN, BSN, [facility] [place] Coordinator [phone]

## 2022-06-07 DIAGNOSIS — Z79891 Long term (current) use of opiate analgesic: Secondary | ICD-10-CM | POA: Diagnosis not present

## 2022-06-07 DIAGNOSIS — Z1389 Encounter for screening for other disorder: Secondary | ICD-10-CM | POA: Diagnosis not present

## 2022-06-07 DIAGNOSIS — G500 Trigeminal neuralgia: Secondary | ICD-10-CM | POA: Diagnosis not present

## 2022-06-07 DIAGNOSIS — G894 Chronic pain syndrome: Secondary | ICD-10-CM | POA: Diagnosis not present

## 2022-06-07 DIAGNOSIS — M722 Plantar fascial fibromatosis: Secondary | ICD-10-CM | POA: Diagnosis not present

## 2022-06-22 DIAGNOSIS — R059 Cough, unspecified: Secondary | ICD-10-CM | POA: Diagnosis not present

## 2022-06-22 DIAGNOSIS — Z20822 Contact with and (suspected) exposure to covid-19: Secondary | ICD-10-CM | POA: Diagnosis not present

## 2022-06-22 DIAGNOSIS — J069 Acute upper respiratory infection, unspecified: Secondary | ICD-10-CM | POA: Diagnosis not present

## 2022-07-11 DIAGNOSIS — Z79891 Long term (current) use of opiate analgesic: Secondary | ICD-10-CM | POA: Diagnosis not present

## 2022-07-11 DIAGNOSIS — G894 Chronic pain syndrome: Secondary | ICD-10-CM | POA: Diagnosis not present

## 2022-07-11 DIAGNOSIS — Z1389 Encounter for screening for other disorder: Secondary | ICD-10-CM | POA: Diagnosis not present

## 2022-07-11 DIAGNOSIS — M722 Plantar fascial fibromatosis: Secondary | ICD-10-CM | POA: Diagnosis not present

## 2022-07-11 DIAGNOSIS — M1990 Unspecified osteoarthritis, unspecified site: Secondary | ICD-10-CM | POA: Diagnosis not present

## 2022-07-11 DIAGNOSIS — G500 Trigeminal neuralgia: Secondary | ICD-10-CM | POA: Diagnosis not present

## 2022-07-12 DIAGNOSIS — I129 Hypertensive chronic kidney disease with stage 1 through stage 4 chronic kidney disease, or unspecified chronic kidney disease: Secondary | ICD-10-CM | POA: Diagnosis not present

## 2022-07-12 DIAGNOSIS — R600 Localized edema: Secondary | ICD-10-CM | POA: Diagnosis not present

## 2022-07-12 DIAGNOSIS — I10 Essential (primary) hypertension: Secondary | ICD-10-CM | POA: Diagnosis not present

## 2022-07-12 DIAGNOSIS — N1832 Chronic kidney disease, stage 3b: Secondary | ICD-10-CM | POA: Diagnosis not present

## 2022-07-14 ENCOUNTER — Telehealth: Payer: Self-pay | Admitting: Neurology

## 2022-07-14 NOTE — Telephone Encounter (Signed)
Patient states that we need to call her [employer] about lowering her tier for the gabapentin we have to do this every year  please call patient

## 2022-07-14 NOTE — Telephone Encounter (Signed)
Will complete once able to. Will update once I am done.

## 2022-07-20 ENCOUNTER — Telehealth: Payer: Self-pay

## 2022-07-20 NOTE — Telephone Encounter (Signed)
Patient states that we need to call her [employer] about lowering her tier exemption for the gabapentin.

## 2022-07-24 DIAGNOSIS — B372 Candidiasis of skin and nail: Secondary | ICD-10-CM | POA: Diagnosis not present

## 2022-07-24 DIAGNOSIS — R69 Illness, unspecified: Secondary | ICD-10-CM | POA: Diagnosis not present

## 2022-07-24 DIAGNOSIS — I10 Essential (primary) hypertension: Secondary | ICD-10-CM | POA: Diagnosis not present

## 2022-07-25 ENCOUNTER — Telehealth: Payer: Self-pay | Admitting: Pharmacy Technician

## 2022-07-25 ENCOUNTER — Other Ambulatory Visit (HOSPITAL_COMMUNITY): Payer: Self-pay

## 2022-07-25 NOTE — Telephone Encounter (Signed)
Patient Advocate Encounter  Received notification from PATIENT that Abigail ([place]) for GABAPENTIN is required.   TIER EXCEPTION submitted on 1.23.24  Key [id number] Status is pending

## 2022-07-26 NOTE — Telephone Encounter (Signed)
Patient Advocate Encounter  TIER EXCEPTION Prior Authorization for GABAPENTIN has been approved.    PA# --- Effective dates: 1.1.24 through 12.31.24

## 2022-07-27 DIAGNOSIS — N1832 Chronic kidney disease, stage 3b: Secondary | ICD-10-CM | POA: Diagnosis not present

## 2022-07-27 DIAGNOSIS — N2581 Secondary hyperparathyroidism of renal origin: Secondary | ICD-10-CM | POA: Diagnosis not present

## 2022-07-27 DIAGNOSIS — I129 Hypertensive chronic kidney disease with stage 1 through stage 4 chronic kidney disease, or unspecified chronic kidney disease: Secondary | ICD-10-CM | POA: Diagnosis not present

## 2022-07-27 DIAGNOSIS — R809 Proteinuria, unspecified: Secondary | ICD-10-CM | POA: Diagnosis not present

## 2022-07-27 DIAGNOSIS — D631 Anemia in chronic kidney disease: Secondary | ICD-10-CM | POA: Diagnosis not present

## 2022-08-02 DIAGNOSIS — N1831 Chronic kidney disease, stage 3a: Secondary | ICD-10-CM | POA: Diagnosis not present

## 2022-08-02 DIAGNOSIS — I129 Hypertensive chronic kidney disease with stage 1 through stage 4 chronic kidney disease, or unspecified chronic kidney disease: Secondary | ICD-10-CM | POA: Diagnosis not present

## 2022-08-02 DIAGNOSIS — R600 Localized edema: Secondary | ICD-10-CM | POA: Diagnosis not present

## 2022-08-09 DIAGNOSIS — G894 Chronic pain syndrome: Secondary | ICD-10-CM | POA: Diagnosis not present

## 2022-08-09 DIAGNOSIS — G500 Trigeminal neuralgia: Secondary | ICD-10-CM | POA: Diagnosis not present

## 2022-08-09 DIAGNOSIS — Z79891 Long term (current) use of opiate analgesic: Secondary | ICD-10-CM | POA: Diagnosis not present

## 2022-08-09 DIAGNOSIS — M1990 Unspecified osteoarthritis, unspecified site: Secondary | ICD-10-CM | POA: Diagnosis not present

## 2022-08-09 DIAGNOSIS — Z1389 Encounter for screening for other disorder: Secondary | ICD-10-CM | POA: Diagnosis not present

## 2022-08-09 DIAGNOSIS — M722 Plantar fascial fibromatosis: Secondary | ICD-10-CM | POA: Diagnosis not present

## 2022-08-13 DIAGNOSIS — R0989 Other specified symptoms and signs involving the circulatory and respiratory systems: Secondary | ICD-10-CM | POA: Diagnosis not present

## 2022-08-13 DIAGNOSIS — R070 Pain in throat: Secondary | ICD-10-CM | POA: Diagnosis not present

## 2022-08-13 DIAGNOSIS — R051 Acute cough: Secondary | ICD-10-CM | POA: Diagnosis not present

## 2022-08-13 DIAGNOSIS — U071 COVID-19: Secondary | ICD-10-CM | POA: Diagnosis not present

## 2022-08-13 DIAGNOSIS — R062 Wheezing: Secondary | ICD-10-CM | POA: Diagnosis not present

## 2022-08-19 DIAGNOSIS — U071 COVID-19: Secondary | ICD-10-CM | POA: Diagnosis not present

## 2022-08-19 DIAGNOSIS — R051 Acute cough: Secondary | ICD-10-CM | POA: Diagnosis not present

## 2022-08-19 DIAGNOSIS — R070 Pain in throat: Secondary | ICD-10-CM | POA: Diagnosis not present

## 2022-08-19 DIAGNOSIS — R0989 Other specified symptoms and signs involving the circulatory and respiratory systems: Secondary | ICD-10-CM | POA: Diagnosis not present

## 2022-08-19 DIAGNOSIS — R062 Wheezing: Secondary | ICD-10-CM | POA: Diagnosis not present

## 2022-09-04 DIAGNOSIS — M545 Low back pain, unspecified: Secondary | ICD-10-CM | POA: Diagnosis not present

## 2022-09-04 DIAGNOSIS — N2581 Secondary hyperparathyroidism of renal origin: Secondary | ICD-10-CM | POA: Diagnosis not present

## 2022-09-04 DIAGNOSIS — R69 Illness, unspecified: Secondary | ICD-10-CM | POA: Diagnosis not present

## 2022-09-04 DIAGNOSIS — E782 Mixed hyperlipidemia: Secondary | ICD-10-CM | POA: Diagnosis not present

## 2022-09-04 DIAGNOSIS — I10 Essential (primary) hypertension: Secondary | ICD-10-CM | POA: Diagnosis not present

## 2022-09-04 DIAGNOSIS — R7303 Prediabetes: Secondary | ICD-10-CM | POA: Diagnosis not present

## 2022-09-04 DIAGNOSIS — D631 Anemia in chronic kidney disease: Secondary | ICD-10-CM | POA: Diagnosis not present

## 2022-09-04 DIAGNOSIS — N1832 Chronic kidney disease, stage 3b: Secondary | ICD-10-CM | POA: Diagnosis not present

## 2022-09-05 DIAGNOSIS — M1990 Unspecified osteoarthritis, unspecified site: Secondary | ICD-10-CM | POA: Diagnosis not present

## 2022-09-05 DIAGNOSIS — G894 Chronic pain syndrome: Secondary | ICD-10-CM | POA: Diagnosis not present

## 2022-09-05 DIAGNOSIS — Z1389 Encounter for screening for other disorder: Secondary | ICD-10-CM | POA: Diagnosis not present

## 2022-09-05 DIAGNOSIS — Z79891 Long term (current) use of opiate analgesic: Secondary | ICD-10-CM | POA: Diagnosis not present

## 2022-09-05 DIAGNOSIS — M722 Plantar fascial fibromatosis: Secondary | ICD-10-CM | POA: Diagnosis not present

## 2022-09-05 DIAGNOSIS — G500 Trigeminal neuralgia: Secondary | ICD-10-CM | POA: Diagnosis not present

## 2022-10-02 DIAGNOSIS — S60119A Contusion of unspecified thumb with damage to nail, initial encounter: Secondary | ICD-10-CM | POA: Diagnosis not present

## 2022-10-02 DIAGNOSIS — M25532 Pain in left wrist: Secondary | ICD-10-CM | POA: Diagnosis not present

## 2022-10-02 DIAGNOSIS — M79644 Pain in right finger(s): Secondary | ICD-10-CM | POA: Diagnosis not present

## 2022-10-02 DIAGNOSIS — R202 Paresthesia of skin: Secondary | ICD-10-CM | POA: Diagnosis not present

## 2022-10-03 DIAGNOSIS — G894 Chronic pain syndrome: Secondary | ICD-10-CM | POA: Diagnosis not present

## 2022-10-03 DIAGNOSIS — Z79891 Long term (current) use of opiate analgesic: Secondary | ICD-10-CM | POA: Diagnosis not present

## 2022-10-03 DIAGNOSIS — M1990 Unspecified osteoarthritis, unspecified site: Secondary | ICD-10-CM | POA: Diagnosis not present

## 2022-10-03 DIAGNOSIS — G500 Trigeminal neuralgia: Secondary | ICD-10-CM | POA: Diagnosis not present

## 2022-10-03 DIAGNOSIS — M722 Plantar fascial fibromatosis: Secondary | ICD-10-CM | POA: Diagnosis not present

## 2022-10-03 DIAGNOSIS — Z1389 Encounter for screening for other disorder: Secondary | ICD-10-CM | POA: Diagnosis not present

## 2022-10-23 ENCOUNTER — Other Ambulatory Visit: Payer: Self-pay | Admitting: Neurology

## 2022-10-23 ENCOUNTER — Telehealth: Payer: Self-pay | Admitting: Anesthesiology

## 2022-10-23 NOTE — Telephone Encounter (Signed)
Pt called stating she had a dental cleaning appointment 2 weeks ago and now her trigeminal neuralgia is acting up. States she has been taking 2 tabs of the Gabapenting 300 mg daily but is not making the pain go away, states can she take more than 2 tabs or does she need an appointment to come and discuss with Dr Allena Katz.

## 2022-10-24 NOTE — Telephone Encounter (Signed)
OK to increase gabapentin to 2 tablets twice daily.  Follow-up in the next 2-4 weeks, OK to use work-in.

## 2022-10-24 NOTE — Telephone Encounter (Signed)
OK to increase to gabapentin  at bedtime and continue  in the morning.

## 2022-10-24 NOTE — Telephone Encounter (Signed)
Called patient and informed her of Dr. Eliane Decree recommendations. Patient stated that she has been taking 2 tablets twice daily for 2 weeks now. She states that at night the pain bothers her more.   I informed patient that I will let Dr. Allena Katz know this incase she would like to adjust her medication and give her a call back.

## 2022-10-24 NOTE — Telephone Encounter (Signed)
Called patient and advised her to increase to gabapentin  at bedtime and continue  in the morning. Patient verbalized understanding and is aware someone from the front will give her a call to get her scheduled for a f/u with Dr. Allena Katz.

## 2022-10-25 DIAGNOSIS — I10 Essential (primary) hypertension: Secondary | ICD-10-CM | POA: Diagnosis not present

## 2022-10-25 DIAGNOSIS — N2581 Secondary hyperparathyroidism of renal origin: Secondary | ICD-10-CM | POA: Diagnosis not present

## 2022-10-25 DIAGNOSIS — E8722 Chronic metabolic acidosis: Secondary | ICD-10-CM | POA: Diagnosis not present

## 2022-10-25 DIAGNOSIS — D631 Anemia in chronic kidney disease: Secondary | ICD-10-CM | POA: Diagnosis not present

## 2022-10-25 DIAGNOSIS — R809 Proteinuria, unspecified: Secondary | ICD-10-CM | POA: Diagnosis not present

## 2022-10-25 DIAGNOSIS — R600 Localized edema: Secondary | ICD-10-CM | POA: Diagnosis not present

## 2022-10-25 DIAGNOSIS — N1832 Chronic kidney disease, stage 3b: Secondary | ICD-10-CM | POA: Diagnosis not present

## 2022-10-25 DIAGNOSIS — I129 Hypertensive chronic kidney disease with stage 1 through stage 4 chronic kidney disease, or unspecified chronic kidney disease: Secondary | ICD-10-CM | POA: Diagnosis not present

## 2022-10-31 DIAGNOSIS — Z79891 Long term (current) use of opiate analgesic: Secondary | ICD-10-CM | POA: Diagnosis not present

## 2022-10-31 DIAGNOSIS — G500 Trigeminal neuralgia: Secondary | ICD-10-CM | POA: Diagnosis not present

## 2022-10-31 DIAGNOSIS — M722 Plantar fascial fibromatosis: Secondary | ICD-10-CM | POA: Diagnosis not present

## 2022-10-31 DIAGNOSIS — G894 Chronic pain syndrome: Secondary | ICD-10-CM | POA: Diagnosis not present

## 2022-10-31 DIAGNOSIS — Z1389 Encounter for screening for other disorder: Secondary | ICD-10-CM | POA: Diagnosis not present

## 2022-10-31 DIAGNOSIS — M1990 Unspecified osteoarthritis, unspecified site: Secondary | ICD-10-CM | POA: Diagnosis not present

## 2022-11-02 DIAGNOSIS — I129 Hypertensive chronic kidney disease with stage 1 through stage 4 chronic kidney disease, or unspecified chronic kidney disease: Secondary | ICD-10-CM | POA: Diagnosis not present

## 2022-11-02 DIAGNOSIS — N179 Acute kidney failure, unspecified: Secondary | ICD-10-CM | POA: Diagnosis not present

## 2022-11-02 DIAGNOSIS — N1832 Chronic kidney disease, stage 3b: Secondary | ICD-10-CM | POA: Diagnosis not present

## 2022-11-02 DIAGNOSIS — N2581 Secondary hyperparathyroidism of renal origin: Secondary | ICD-10-CM | POA: Diagnosis not present

## 2022-11-02 DIAGNOSIS — E8722 Chronic metabolic acidosis: Secondary | ICD-10-CM | POA: Diagnosis not present

## 2022-11-02 DIAGNOSIS — D631 Anemia in chronic kidney disease: Secondary | ICD-10-CM | POA: Diagnosis not present

## 2022-11-03 DIAGNOSIS — H1032 Unspecified acute conjunctivitis, left eye: Secondary | ICD-10-CM | POA: Diagnosis not present

## 2022-11-06 DIAGNOSIS — D2239 Melanocytic nevi of other parts of face: Secondary | ICD-10-CM | POA: Diagnosis not present

## 2022-11-06 DIAGNOSIS — D225 Melanocytic nevi of trunk: Secondary | ICD-10-CM | POA: Diagnosis not present

## 2022-11-06 DIAGNOSIS — L820 Inflamed seborrheic keratosis: Secondary | ICD-10-CM | POA: Diagnosis not present

## 2022-11-06 DIAGNOSIS — L814 Other melanin hyperpigmentation: Secondary | ICD-10-CM | POA: Diagnosis not present

## 2022-11-10 ENCOUNTER — Other Ambulatory Visit: Payer: Self-pay | Admitting: Neurology

## 2022-11-10 MED ORDER — GABAPENTIN 300 MG PO CAPS: ORAL_CAPSULE | ORAL | 3 refills | Status: DC

## 2022-11-10 NOTE — Telephone Encounter (Signed)
New message   1. Which medications need to be refilled? (please list name of each medication and dose if known) gabapentin (NEURONTIN) 300 MG capsule   2. Which pharmacy/location (including street and city if local pharmacy) is medication to be sent to?[id number] - [place] [place] - [address] [facility]   3. Do they need a 30 day or 90 day supply? 90 day supply

## 2022-11-15 DIAGNOSIS — Z79891 Long term (current) use of opiate analgesic: Secondary | ICD-10-CM | POA: Diagnosis not present

## 2022-11-15 DIAGNOSIS — Z1389 Encounter for screening for other disorder: Secondary | ICD-10-CM | POA: Diagnosis not present

## 2022-11-15 DIAGNOSIS — M722 Plantar fascial fibromatosis: Secondary | ICD-10-CM | POA: Diagnosis not present

## 2022-11-15 DIAGNOSIS — G500 Trigeminal neuralgia: Secondary | ICD-10-CM | POA: Diagnosis not present

## 2022-11-15 DIAGNOSIS — M1990 Unspecified osteoarthritis, unspecified site: Secondary | ICD-10-CM | POA: Diagnosis not present

## 2022-11-15 DIAGNOSIS — G894 Chronic pain syndrome: Secondary | ICD-10-CM | POA: Diagnosis not present

## 2022-11-21 ENCOUNTER — Ambulatory Visit: Payer: Medicare HMO | Admitting: Neurology

## 2022-11-21 ENCOUNTER — Encounter: Payer: Self-pay | Admitting: Neurology

## 2022-11-21 VITALS — BP 134/72 | HR 109 | Ht 60.0 in | Wt 200.0 lb

## 2022-11-21 DIAGNOSIS — G500 Trigeminal neuralgia: Secondary | ICD-10-CM | POA: Diagnosis not present

## 2022-11-21 MED ORDER — GABAPENTIN 300 MG PO CAPS: 900.0000 mg | ORAL_CAPSULE | Freq: Two times a day (BID) | ORAL | 3 refills | Status: DC

## 2022-11-21 NOTE — Progress Notes (Addendum)
Follow-up Visit   Date: 11/21/2022     Abigail Glover MRN: [id number] DOB: [birth year]   Interim History: Abigail Glover is a 77 y.o. left-handed Caucasian female with hypertension returning to the clinic for follow-up of left trigeminal neuralgia.  The patient was accompanied to the clinic by self.  IMPRESSION/PLAN: Left trigeminal neuralgia, diagnosed 2014, worsening after dental cleaning  - Increase gabapentin 900mg  twice daily  Return to clinic in 6 months  ------------------------------------------- UPDATE 11/21/2022:  She had her teeth cleaned in April and immediately developed worsening left trigeminal neuralgia.  Pain has been severe so she contacted my office and was instructed to increase gabapentin to 900mg  at bedtime and continue 600mg  in the morning.  She is able to brush but tries to avoid chewing or drinking water on the left side.   She remains off carbamazepine.   She had nonmalignant ovarian masses which was resected in December 2022.    Medications:  Current Outpatient Medications on File Prior to Visit  Medication Sig Dispense Refill   b complex vitamins capsule Take 1 capsule by mouth daily.     cholecalciferol (VITAMIN D) 25 MCG (1000 UNIT) tablet Take 1,000 Units by mouth daily.     dicyclomine (BENTYL) 10 MG capsule Take 10 mg by mouth 3 (three) times daily as needed for spasms.     Eszopiclone 3 MG TABS Take 3 mg by mouth at bedtime.     gabapentin (NEURONTIN) 300 MG capsule Take 1 -2 tablets daily as needed for pain. 180 capsule 3   hydrALAZINE (APRESOLINE) 25 MG tablet Take 25 mg by mouth 3 (three) times daily.     HYDROcodone-acetaminophen (NORCO/VICODIN) 5-325 MG tablet Take 1 tablet by mouth in the morning, at noon, and at bedtime.     olmesartan (BENICAR) 40 MG tablet Take 40 mg by mouth daily.     pantoprazole (PROTONIX) 40 MG tablet Take 40 mg by mouth daily.     rosuvastatin (CRESTOR) 5 MG tablet Take 5 mg by mouth daily.     zolpidem  (AMBIEN) 10 MG tablet Take 10 mg by mouth at bedtime. (Patient not taking: Reported on 02/10/2022)     No current facility-administered medications on file prior to visit.    Allergies:  Allergies  Allergen Reactions   Amitriptyline     Wouldn't let PT go to sleep    Amlodipine     Leg swelling   Citalopram     Pt does not recall reaction    Hydrochlorothiazide W-Triamterene     Per kidney Dr, was taken off med    Metoprolol     Pt does not recall reaction    Penicillin G     Per Patient " I have never actually taken penicillin but I broke out in a rash on the tops of my legs with cephalexin and was told not to take penicillin."   Prednisone     Low doses are ok  - intolerance to higher dosage, affects mental state   Cephalexin Rash    Vital Signs:  BP (!) 142/73   Pulse 95   Resp 18   Ht 5' (1.524 m)   Wt 188 lb (85.3 kg)   SpO2 100%   BMI 36.72 kg/m    Neurological Exam: MENTAL STATUS including orientation to time, place, person, recent and remote memory, attention span and concentration, language, and fund of knowledge is normal.  Speech is not dysarthric.  CRANIAL NERVES:  Pupils equal round and reactive to light.  Normal conjugate, extra-ocular eye movements in all directions of gaze. Mild left ptosis (old). Face is symmetric. Facial sensation intact. Palate elevates symmetrically.  Tongue is midline.  MOTOR:  Motor strength is 5/5 in all extremities.  No pronator drift.  Tone is normal.    COORDINATION/GAIT:    Gait narrow based and stable.     Thank you for allowing me to participate in patient's care.  If I can answer any additional questions, I would be pleased to do so.    Sincerely,    Clelia Trabucco K. Allena Katz, DO

## 2022-11-21 NOTE — Patient Instructions (Signed)
Increase gabapentin 900mg  twice daily  I will see you back in August

## 2022-12-05 DIAGNOSIS — H524 Presbyopia: Secondary | ICD-10-CM | POA: Diagnosis not present

## 2022-12-07 ENCOUNTER — Telehealth: Payer: Self-pay | Admitting: Neurology

## 2022-12-07 NOTE — Telephone Encounter (Signed)
We can increase gabapentin to add 300mg  in the afternoon x 1 week, then 600mg  in the afternoon.  Continue gabapentin 900mg  in the morning and bedtime.

## 2022-12-07 NOTE — Telephone Encounter (Signed)
Pt called in stating she is still having those attacks. She is wondering if her gabapentin dosage needs to be changed?

## 2022-12-07 NOTE — Telephone Encounter (Signed)
Called and spoke to patient and informed her that per Dr. Allena Katz We can increase gabapentin to add 300mg  in the afternoon x 1 week, then 600mg  in the afternoon. Continue gabapentin 900mg  in the morning and bedtime. Patient verbalized understanding and had no further questions or concerns.

## 2022-12-13 DIAGNOSIS — M722 Plantar fascial fibromatosis: Secondary | ICD-10-CM | POA: Diagnosis not present

## 2022-12-13 DIAGNOSIS — G500 Trigeminal neuralgia: Secondary | ICD-10-CM | POA: Diagnosis not present

## 2022-12-13 DIAGNOSIS — Z79891 Long term (current) use of opiate analgesic: Secondary | ICD-10-CM | POA: Diagnosis not present

## 2022-12-13 DIAGNOSIS — G894 Chronic pain syndrome: Secondary | ICD-10-CM | POA: Diagnosis not present

## 2022-12-13 DIAGNOSIS — Z1389 Encounter for screening for other disorder: Secondary | ICD-10-CM | POA: Diagnosis not present

## 2023-01-01 DIAGNOSIS — M1990 Unspecified osteoarthritis, unspecified site: Secondary | ICD-10-CM | POA: Diagnosis not present

## 2023-01-01 DIAGNOSIS — Z1389 Encounter for screening for other disorder: Secondary | ICD-10-CM | POA: Diagnosis not present

## 2023-01-01 DIAGNOSIS — G500 Trigeminal neuralgia: Secondary | ICD-10-CM | POA: Diagnosis not present

## 2023-01-01 DIAGNOSIS — Z79891 Long term (current) use of opiate analgesic: Secondary | ICD-10-CM | POA: Diagnosis not present

## 2023-01-01 DIAGNOSIS — G894 Chronic pain syndrome: Secondary | ICD-10-CM | POA: Diagnosis not present

## 2023-01-01 DIAGNOSIS — M722 Plantar fascial fibromatosis: Secondary | ICD-10-CM | POA: Diagnosis not present

## 2023-01-29 ENCOUNTER — Telehealth: Payer: Self-pay | Admitting: Neurology

## 2023-01-29 MED ORDER — GABAPENTIN 300 MG PO CAPS
ORAL_CAPSULE | ORAL | 3 refills | Status: DC
Start: 2023-01-29 — End: 2023-01-31

## 2023-01-29 NOTE — Telephone Encounter (Signed)
Rx sent to take gabapentin 900mg  in the morning, 900mg  in the afternoon, and 1200mg  at bedtime.

## 2023-01-29 NOTE — Telephone Encounter (Signed)
Please see if she would like to add carbamazepine 200mg  twice daily. She was on this in the past (2021) for her facial pain in combination with gabapentin, which had helped when pain was severe.

## 2023-01-29 NOTE — Telephone Encounter (Signed)
Pt is calling in stating that the pharmacy is needing a new prescription 3 capsules 3 times a day 300 MG. Pharm:  [facility] in [place], [place]  [place] [phone].  Pt stated that she feel that the dosage that she has now is not really working for her and would like to see if it could be upped.  Pt is aware that she would have to been seen in order for a possible dosage change.  She stated she understand and will see the provider on 02/06/2023.

## 2023-01-29 NOTE — Telephone Encounter (Signed)
Called and spoke to patient and asked her if she would like to add carbamazepine 200mg  twice daily. She was on this in the past (2021) for her facial pain in combination with gabapentin, which had helped when pain was severe. Patient wanted to see if her gabapentin could be increased instead for now? And she will discuss the other medication carbamazepine with Dr. Allena Katz at her follow up on 02/06/23. Patient will need a refill as she will run out by tomorrow.

## 2023-01-29 NOTE — Telephone Encounter (Signed)
Called patient and informed her that Rx sent to take gabapentin 900mg  in the morning, 900mg  in the afternoon, and 1200mg  at bedtime. Patient verbalized understanding and had no further questions or concerns.

## 2023-01-31 ENCOUNTER — Telehealth: Payer: Self-pay | Admitting: Neurology

## 2023-01-31 MED ORDER — GABAPENTIN 300 MG PO CAPS: ORAL_CAPSULE | ORAL | 3 refills | Status: DC

## 2023-01-31 NOTE — Telephone Encounter (Signed)
Left message with the after hour service on 01-30-23 at 10:14 pm  Caller states that the pharmacy states that her ins would not pay for the medication as it was missing some information. She is out of medication   [phone]

## 2023-01-31 NOTE — Telephone Encounter (Signed)
Updated Rx

## 2023-01-31 NOTE — Telephone Encounter (Signed)
Called pharmacy and was informed that a note needs to be added to patients Gabapentin prescription of the original start start of when the Gabapentin was upped. It looks like it may have been upped on 12/07/22 (see telephone encounter).   The reason the pharmacy was unable to fill is because per the Insurance she should have more of the previous gabapentin medication to cover this upped dose if it started on 01/29/23. However, the upped dose started on 12/07/22 and the prescription needs to state that so it can get filled.

## 2023-01-31 NOTE — Telephone Encounter (Signed)
Called patient and informed her the prescription has been updated and a note added so that the pharmacy can refill her Gabapentin. Patient will call us if she has any issues.

## 2023-02-06 ENCOUNTER — Encounter: Payer: Self-pay | Admitting: Neurology

## 2023-02-06 ENCOUNTER — Ambulatory Visit: Payer: Medicare HMO | Admitting: Neurology

## 2023-02-06 VITALS — BP 152/72 | HR 117 | Ht 60.0 in | Wt 200.0 lb

## 2023-02-06 DIAGNOSIS — G500 Trigeminal neuralgia: Secondary | ICD-10-CM

## 2023-02-06 NOTE — Progress Notes (Signed)
Follow-up Visit   Date: 02/06/2023     Abigail Glover MRN: [id number] DOB: 05/06/1946   Interim History: Abigail Glover is a 77 y.o. left-handed Caucasian female with hypertension returning to the clinic for follow-up of left trigeminal neuralgia.  The patient was accompanied to the clinic by self.  IMPRESSION/PLAN: Left trigeminal neuralgia, diagnosed 2014, worsening after dental cleaning  - Adjust gabapentin to 600mg  in the morning, 1200mg  in the afternoon, and 1200mg  at bedtime  - If pain remains refractory, consider carbamazepine 100mg  BID   Return to clinic in 4 months  ------------------------------------------- UPDATE 11/21/2022:  She had her teeth cleaned in April and immediately developed worsening left trigeminal neuralgia.  Pain has been severe so she contacted my office and was instructed to increase gabapentin to 900mg  at bedtime and continue 600mg  in the morning.  She is able to brush but tries to avoid chewing or drinking water on the left side.   She remains off carbamazepine.   She had nonmalignant ovarian masses which was resected in December 2022.    UPDATE 02/06/2023:  She is here for follow-up.  She is taking gabapentin [phone] and reports still having breakthrough left facial pain.  Pain tends to occur around 9pm - 11pm after she returns home from work.  She rarely has facial pain during the daytime.   Medications:  Current Outpatient Medications on File Prior to Visit  Medication Sig Dispense Refill   cholecalciferol (VITAMIN D) 25 MCG (1000 UNIT) tablet Take 1,000 Units by mouth daily.     gabapentin (NEURONTIN) 300 MG capsule Take 3 tablets in the morning, 3 tablets in the afternoon, and 4 tablets at bedtime. 300 capsule 3   hydrALAZINE (APRESOLINE) 25 MG tablet Take 25 mg by mouth 3 (three) times daily.     HYDROcodone-acetaminophen (NORCO/VICODIN) 5-325 MG tablet Take 1 tablet by mouth in the morning, at noon, and at bedtime.     olmesartan  (BENICAR) 40 MG tablet Take 40 mg by mouth daily.     Pantoprazole Sodium (PROTONIX PO) Take by mouth.     rosuvastatin (CRESTOR) 5 MG tablet Take 5 mg by mouth daily.     zolpidem (AMBIEN) 10 MG tablet Take 10 mg by mouth at bedtime.     No current facility-administered medications on file prior to visit.    Allergies:  Allergies  Allergen Reactions   Amitriptyline     Wouldn't let PT go to sleep    Amlodipine     Leg swelling   Citalopram     Pt does not recall reaction    Hydrochlorothiazide W-Triamterene     Per kidney Dr, was taken off med    Metoprolol     Pt does not recall reaction    Penicillin G     Per Patient " I have never actually taken penicillin but I broke out in a rash on the tops of my legs with cephalexin and was told not to take penicillin."   Prednisone     Low doses are ok  - intolerance to higher dosage, affects mental state   Cephalexin Rash    Vital Signs:  BP (!) 152/72   Pulse (!) 117   Ht 5' (1.524 m)   Wt 200 lb (90.7 kg)   SpO2 92%   BMI 39.06 kg/m    Neurological Exam: MENTAL STATUS including orientation to time, place, person, recent and remote memory, attention span and concentration, language, and fund of  knowledge is normal.  Speech is not dysarthric.  CRANIAL NERVES:  Pupils equal round and reactive to light.  Normal conjugate, extra-ocular eye movements in all directions of gaze. Mild left ptosis (old). Face is symmetric. Facial sensation intact. Palate elevates symmetrically.  Tongue is midline.  MOTOR:  Motor strength is 5/5 in all extremities.  No pronator drift.  Tone is normal.    COORDINATION/GAIT:    Gait narrow based and stable.     Thank you for allowing me to participate in patient's care.  If I can answer any additional questions, I would be pleased to do so.    Sincerely,     K. Allena Katz, DO

## 2023-02-06 NOTE — Patient Instructions (Addendum)
Adjust gabapentin to 600mg  (2 tablets) at 9a, 1200mg  (4 tablets) at 3pm, and 1200mg  (4 tablets) at 9pm.  I will see you back in 3 months

## 2023-02-07 DIAGNOSIS — G894 Chronic pain syndrome: Secondary | ICD-10-CM | POA: Diagnosis not present

## 2023-02-07 DIAGNOSIS — Z79891 Long term (current) use of opiate analgesic: Secondary | ICD-10-CM | POA: Diagnosis not present

## 2023-02-07 DIAGNOSIS — G500 Trigeminal neuralgia: Secondary | ICD-10-CM | POA: Diagnosis not present

## 2023-02-07 DIAGNOSIS — Z1389 Encounter for screening for other disorder: Secondary | ICD-10-CM | POA: Diagnosis not present

## 2023-02-07 DIAGNOSIS — M722 Plantar fascial fibromatosis: Secondary | ICD-10-CM | POA: Diagnosis not present

## 2023-02-09 ENCOUNTER — Ambulatory Visit: Payer: Medicare HMO | Admitting: Neurology

## 2023-02-22 ENCOUNTER — Telehealth: Payer: Self-pay | Admitting: Neurology

## 2023-02-22 DIAGNOSIS — H2513 Age-related nuclear cataract, bilateral: Secondary | ICD-10-CM | POA: Diagnosis not present

## 2023-02-22 MED ORDER — CARBAMAZEPINE 200 MG PO TABS: ORAL_TABLET | ORAL | 3 refills | Status: DC

## 2023-02-22 NOTE — Telephone Encounter (Signed)
Please let pt know that I will start carbamazepine take half tabet twice daily x 2 weeks, then increase to 1 tablet twice daily.  Side effects include dizziness.  If she develops any rash, stop immediately.  Rx sent.  Thank you.

## 2023-02-22 NOTE — Telephone Encounter (Signed)
Caller stated Gabapentin medication is not working. Stated she was told to call office to let Dr Allena Katz know

## 2023-02-22 NOTE — Telephone Encounter (Signed)
No, please have her continue gabapentin as she is taking. We will plan to reduce the dose after her pain is better controlled.

## 2023-02-22 NOTE — Telephone Encounter (Signed)
Called patient and informed her that Dr. Allena Katz will start carbamazepine take half tabet twice daily x 2 weeks, then increase to 1 tablet twice daily. Side effects include dizziness. If you develop any rash, stop immediately and contact our office. Rx sent.   Patient verbalized understanding of all explained to her. Patient wanted to know if she has to stop the Gabapentin? Patient informed me that I may leave a detailed message.

## 2023-02-22 NOTE — Telephone Encounter (Signed)
Called patient and informed her that she will continue gabapentin as she is taking. We will plan to reduce the dose after her pain is better controlled. Patient verbalized understanding and had no further questions or concerns.

## 2023-02-27 IMAGING — CT CT ABD-PELV W/ CM
2 of 5 series · 15 of 46 positions shown, 17 images · IV contrast (omnipaque)
Comparison: None.

CLINICAL DATA: Adnexal mass, malignancy suspected

EXAM:
CT ABDOMEN AND PELVIS WITH CONTRAST
TECHNIQUE: Multidetector CT imaging of the abdomen and pelvis was performed
using the standard protocol following bolus administration of
intravenous contrast.
CONTRAST:  80mL OMNIPAQUE IOHEXOL 350 MG/ML SOLN

[Series 2: axial st · axial · 0.86mm/px · z∈[+1131,+1506]mm · 12 of 89 slices shown, 14 images]
[im 7/89  soft-tissue]
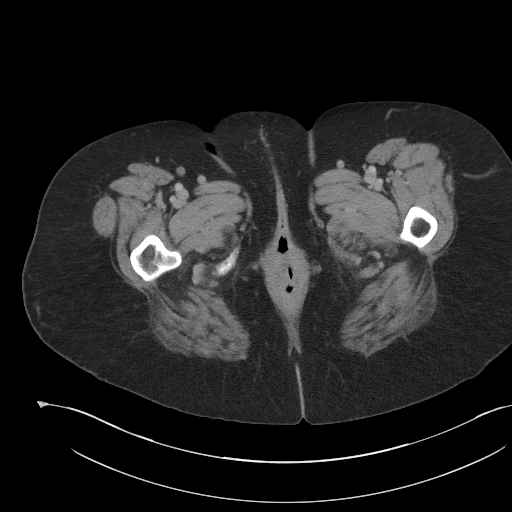
[im 7/89  bone]
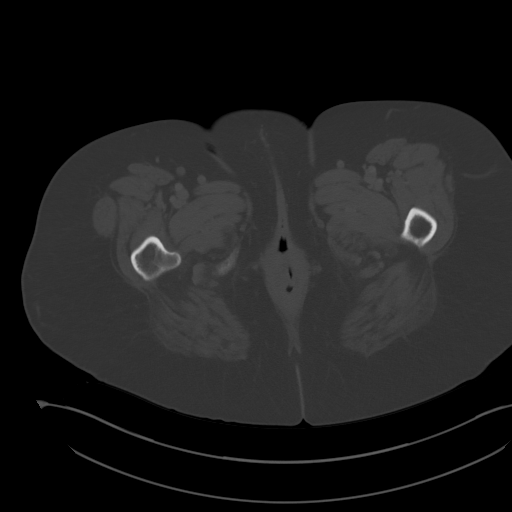
[im 14/89  soft-tissue]
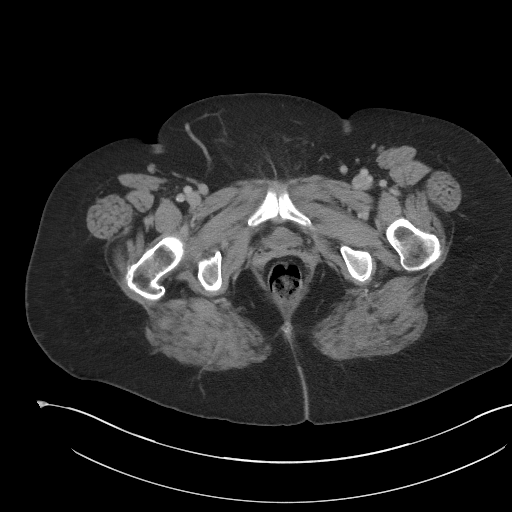
[im 21/89  soft-tissue]
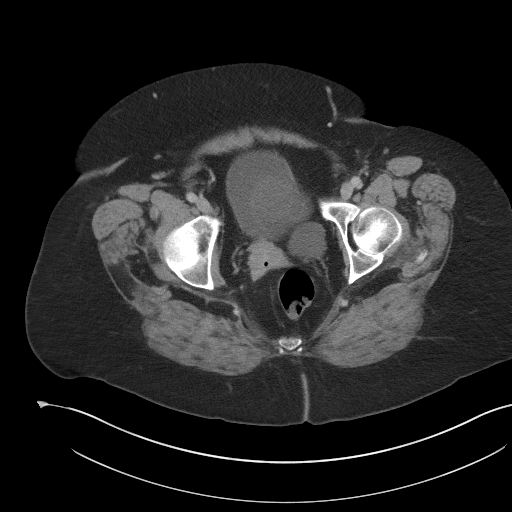
[im 28/89  soft-tissue]
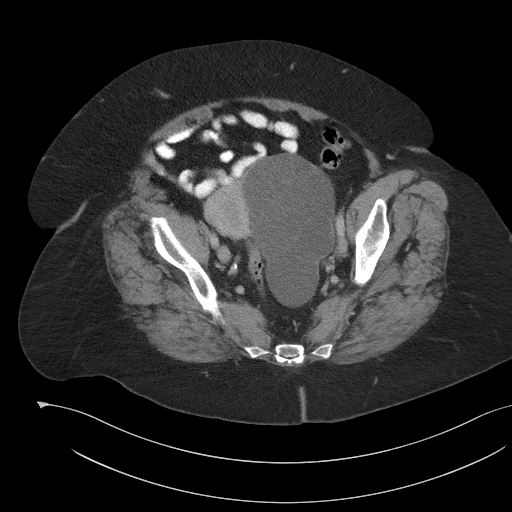
[im 34/89  soft-tissue]
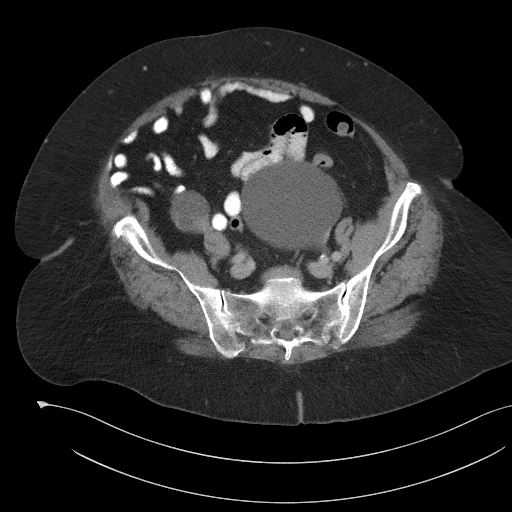
[im 41/89  soft-tissue]
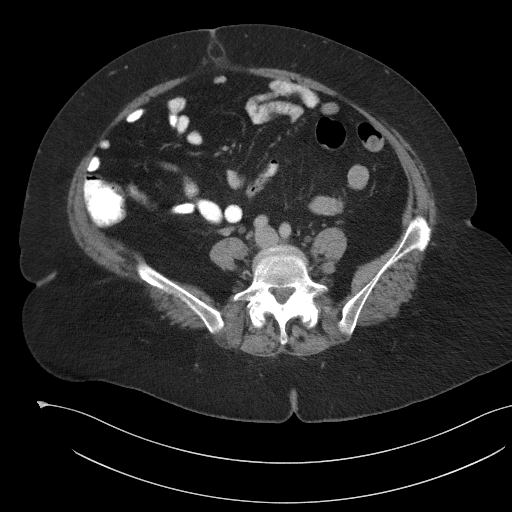
[im 48/89  soft-tissue]
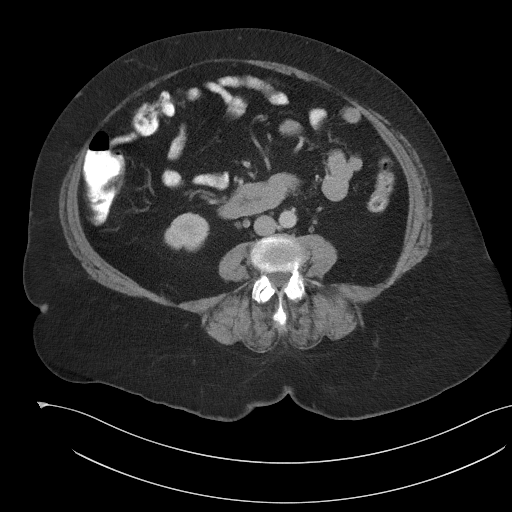
[im 55/89  soft-tissue]
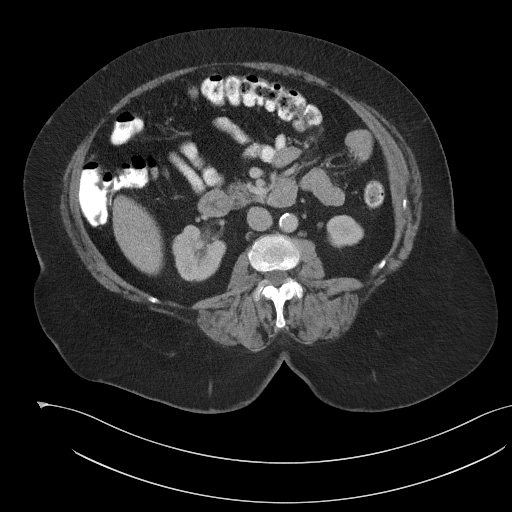
[im 61/89  soft-tissue]
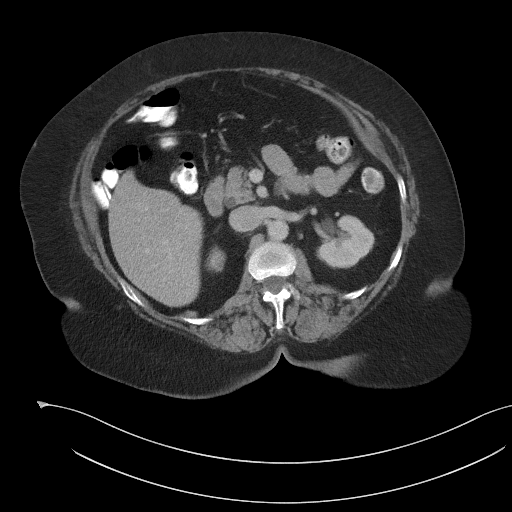
[im 61/89  bone]
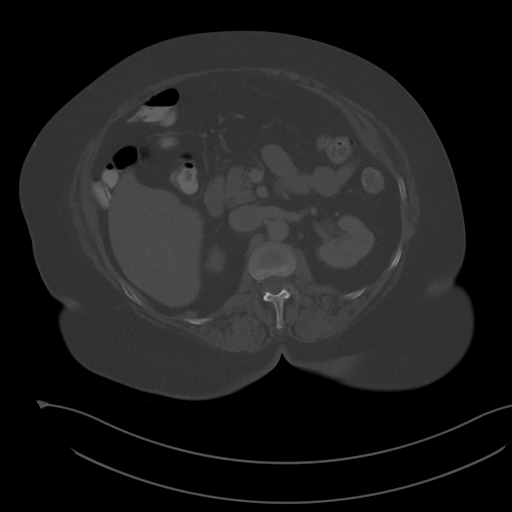
[im 68/89  soft-tissue]
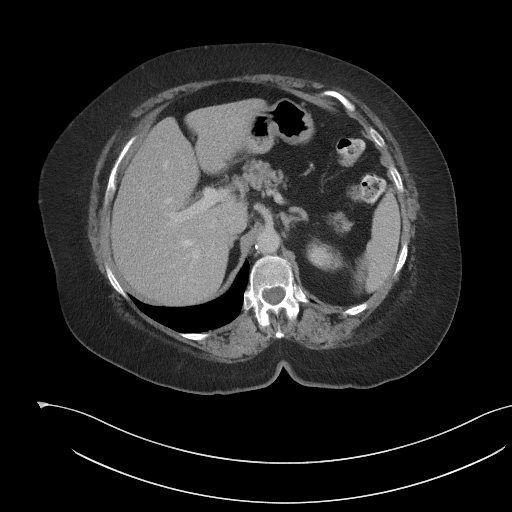
[im 75/89  soft-tissue]
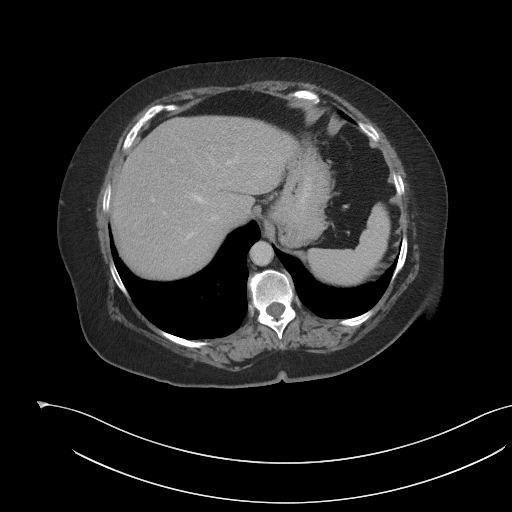
[im 82/89  soft-tissue]
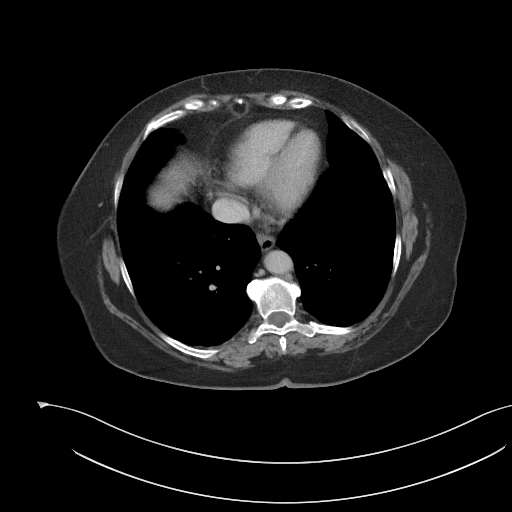

[Series 5: coronal st · coronal · 0.72mm/px · 3 of 115 slices shown]
[im 39/115  soft-tissue]
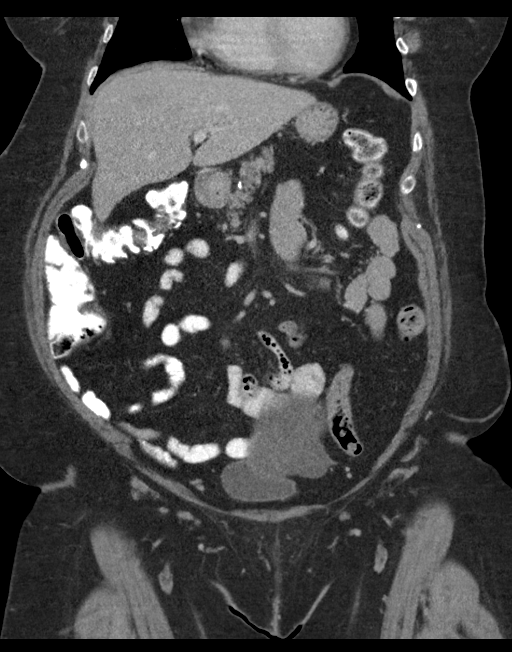
[im 51/115  soft-tissue]
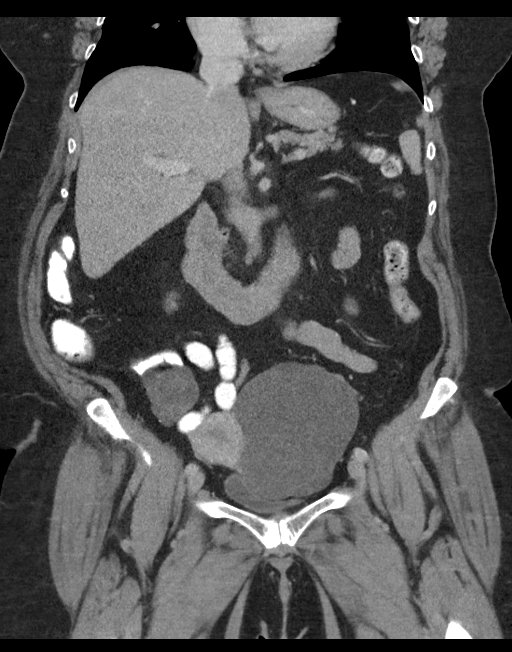
[im 64/115  soft-tissue]
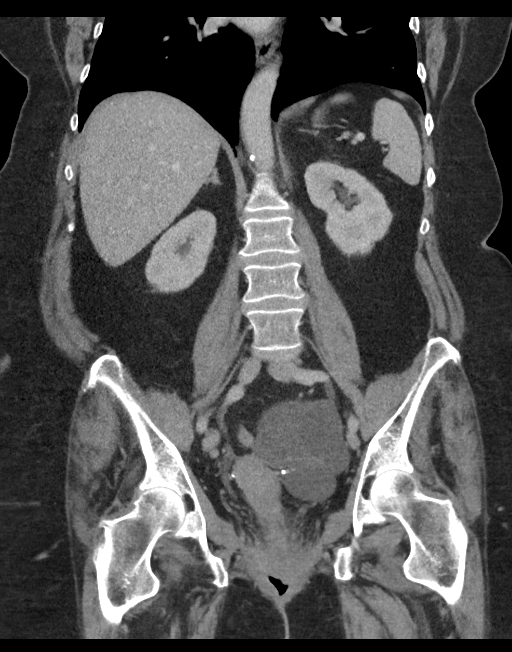

[15 of 46 positions shown; findings below may reference images not displayed]

FINDINGS: Lower chest: No acute abnormality.

Hepatobiliary: No focal liver abnormality. Status post
cholecystectomy. No biliary dilatation.

Pancreas: No focal lesion. Normal pancreatic contour. No surrounding
inflammatory changes. No main pancreatic ductal dilatation.

Spleen: Normal in size without focal abnormality. Splenule is noted.

Adrenals/Urinary Tract:

No adrenal nodule bilaterally.

Bilateral kidneys enhance symmetrically.

No hydronephrosis. No hydroureter.

The urinary bladder is unremarkable.

On delayed imaging, there is no urothelial wall thickening and there
are no filling defects in the opacified portions of the bilateral
collecting systems or ureters.

Stomach/Bowel: PO contrast reaches the distal descending colon.
Stomach is within normal limits. No evidence of bowel wall
thickening or dilatation. The appendix is not definitely identified
with no inflammatory changes in the right lower quadrant to suggest
acute appendicitis.

Vascular/Lymphatic: No abdominal aorta or iliac aneurysm. Mild
atherosclerotic plaque of the aorta and its branches. No abdominal,
pelvic, or inguinal lymphadenopathy.

Reproductive: The uterus is unremarkable. There is a multiseptated
4.4 x 3.6 cm right ovarian cystic lesion. There is a multi septated
13.3 x 7.7 cm left ovarian cystic lesion.

Other: No definite peritoneal nodule or mass. No intraperitoneal
free fluid. No intraperitoneal free gas. No organized fluid
collection.

Musculoskeletal:

No abdominal wall hernia or abnormality.

No suspicious lytic or blastic osseous lesions. No acute displaced
fracture.
IMPRESSION: 1. A multi-septated 13.3 x 7.7 cm left ovarian cystic lesion. A
multiseptated 4.4 x 3.6 cm right ovarian cystic lesion. Findings
concerning for malignancy. Recommend gynecologic consultation.
2.  Aortic Atherosclerosis ([id number]).

These results will be called to the ordering clinician or
representative by the Radiologist Assistant, and communication
documented in the PACS or [REDACTED].

## 2023-03-07 DIAGNOSIS — M722 Plantar fascial fibromatosis: Secondary | ICD-10-CM | POA: Diagnosis not present

## 2023-03-07 DIAGNOSIS — G500 Trigeminal neuralgia: Secondary | ICD-10-CM | POA: Diagnosis not present

## 2023-03-07 DIAGNOSIS — Z1389 Encounter for screening for other disorder: Secondary | ICD-10-CM | POA: Diagnosis not present

## 2023-03-07 DIAGNOSIS — Z79891 Long term (current) use of opiate analgesic: Secondary | ICD-10-CM | POA: Diagnosis not present

## 2023-03-07 DIAGNOSIS — G894 Chronic pain syndrome: Secondary | ICD-10-CM | POA: Diagnosis not present

## 2023-03-07 DIAGNOSIS — M1990 Unspecified osteoarthritis, unspecified site: Secondary | ICD-10-CM | POA: Diagnosis not present

## 2023-03-09 DIAGNOSIS — H2511 Age-related nuclear cataract, right eye: Secondary | ICD-10-CM | POA: Diagnosis not present

## 2023-03-28 DIAGNOSIS — H2512 Age-related nuclear cataract, left eye: Secondary | ICD-10-CM | POA: Diagnosis not present

## 2023-03-28 DIAGNOSIS — H52222 Regular astigmatism, left eye: Secondary | ICD-10-CM | POA: Diagnosis not present

## 2023-04-04 ENCOUNTER — Telehealth: Payer: Self-pay | Admitting: Neurology

## 2023-04-04 DIAGNOSIS — G894 Chronic pain syndrome: Secondary | ICD-10-CM | POA: Diagnosis not present

## 2023-04-04 DIAGNOSIS — Z1389 Encounter for screening for other disorder: Secondary | ICD-10-CM | POA: Diagnosis not present

## 2023-04-04 DIAGNOSIS — G500 Trigeminal neuralgia: Secondary | ICD-10-CM | POA: Diagnosis not present

## 2023-04-04 DIAGNOSIS — M1990 Unspecified osteoarthritis, unspecified site: Secondary | ICD-10-CM | POA: Diagnosis not present

## 2023-04-04 DIAGNOSIS — Z79891 Long term (current) use of opiate analgesic: Secondary | ICD-10-CM | POA: Diagnosis not present

## 2023-04-04 DIAGNOSIS — M722 Plantar fascial fibromatosis: Secondary | ICD-10-CM | POA: Diagnosis not present

## 2023-04-04 NOTE — Telephone Encounter (Signed)
Caller would like to speak with nurse about neuralgia flare up.

## 2023-04-05 ENCOUNTER — Telehealth: Payer: Self-pay | Admitting: Neurology

## 2023-04-05 NOTE — Telephone Encounter (Signed)
Called patient and left a message for a call back.  

## 2023-04-05 NOTE — Telephone Encounter (Signed)
Patient called back stating that she was returning a Nurses call. Patient is having issues with her Trigeminal Neuralgia

## 2023-04-05 NOTE — Telephone Encounter (Signed)
I'm sorry to hear this, but hopefully the pain is settling out again.   If pain gets worse, she may increase carbamazepine to 200mg  three times daily.

## 2023-04-05 NOTE — Telephone Encounter (Signed)
Called patient and she stated that she had cataract surgery on Thursday. Since then it feels like it has aggravated her trigeminal neuralgia on the left side. Fortunately, it has not been bothering her today. Patient states that it has been on and off. Patient has been taking Gabapentin 2 in the morning, 4 afternoon and 4 at bedtime. Also has been taking Carbamazepine 1 in the morning and 1 at night. Patient did state that yesterday was really rough but thankfully today the pain has calmed down. Patient states she does have an eye appointment today at 10:30 am for check up.

## 2023-04-06 NOTE — Telephone Encounter (Signed)
Called patient and informed her of Dr. Eliane Decree recommendations. Patient verbalized understanding,. Patient will give [place] a call if there is no improvement or symptoms worsen.

## 2023-04-12 DIAGNOSIS — Z79899 Other long term (current) drug therapy: Secondary | ICD-10-CM | POA: Diagnosis not present

## 2023-04-12 DIAGNOSIS — N1832 Chronic kidney disease, stage 3b: Secondary | ICD-10-CM | POA: Diagnosis not present

## 2023-04-12 DIAGNOSIS — Z139 Encounter for screening, unspecified: Secondary | ICD-10-CM | POA: Diagnosis not present

## 2023-04-12 DIAGNOSIS — D631 Anemia in chronic kidney disease: Secondary | ICD-10-CM | POA: Diagnosis not present

## 2023-04-12 DIAGNOSIS — R7303 Prediabetes: Secondary | ICD-10-CM | POA: Diagnosis not present

## 2023-04-12 DIAGNOSIS — I10 Essential (primary) hypertension: Secondary | ICD-10-CM | POA: Diagnosis not present

## 2023-04-12 DIAGNOSIS — E782 Mixed hyperlipidemia: Secondary | ICD-10-CM | POA: Diagnosis not present

## 2023-04-12 DIAGNOSIS — N2581 Secondary hyperparathyroidism of renal origin: Secondary | ICD-10-CM | POA: Diagnosis not present

## 2023-05-09 ENCOUNTER — Ambulatory Visit: Payer: Medicare HMO | Admitting: Neurology

## 2023-05-09 ENCOUNTER — Encounter: Payer: Self-pay | Admitting: Neurology

## 2023-05-09 VITALS — BP 177/81 | HR 73 | Ht 60.0 in | Wt 208.0 lb

## 2023-05-09 DIAGNOSIS — G500 Trigeminal neuralgia: Secondary | ICD-10-CM

## 2023-05-09 MED ORDER — GABAPENTIN 300 MG PO CAPS
ORAL_CAPSULE | ORAL | 3 refills | Status: AC
Start: 2023-05-09 — End: ?

## 2023-05-09 MED ORDER — CARBAMAZEPINE 200 MG PO TABS
ORAL_TABLET | ORAL | 3 refills | Status: DC
Start: 2023-05-09 — End: 2023-07-23

## 2023-05-09 NOTE — Patient Instructions (Addendum)
Continue taking carbamazepine 200mg  three times daily. When you have severe pain, you make take 1 extra tablet.  If you would like to have MRI brain, please call my office and we can order it.

## 2023-05-09 NOTE — Progress Notes (Signed)
Follow-up Visit   Date: 05/09/2023     Abigail Glover MRN: [id number] DOB: 29-Feb-1946   Interim History: Abigail Glover is a 77 y.o. left-handed Caucasian female with hypertension returning to the clinic for follow-up of left trigeminal neuralgia.  The patient was accompanied to the clinic by self.  IMPRESSION/PLAN: Left trigeminal neuralgia, diagnosed 2014.  She still has breakthrough pain occurring 1-2 times per week, which lasts 30-min.  - Take carbamazepine 200mg  three times daily.  OK to take extra dose for severe pain  - Continue gabapentin 600mg  in the morning, 1200mg  in the afternoon, and 1200mg  at bedtime.  - MRI trigeminal nerve protocol was discussed, she will call the office if she would like to proceed with this  - I also discussed surgical options for refractory pain going forward  Return to clinic in 4 months  ------------------------------------------- UPDATE 11/21/2022:  She had her teeth cleaned in April and immediately developed worsening left trigeminal neuralgia.  Pain has been severe so she contacted my office and was instructed to increase gabapentin to 900mg  at bedtime and continue 600mg  in the morning.  She is able to brush but tries to avoid chewing or drinking water on the left side.   She remains off carbamazepine.   She had nonmalignant ovarian masses which was resected in December 2022.    UPDATE 02/06/2023:  She is here for follow-up.  She is taking gabapentin [phone] and reports still having breakthrough left facial pain.  Pain tends to occur around 9pm - 11pm after she returns home from work.  She rarely has facial pain during the daytime.   UPDATE 05/09/2023:  She is here for follow-up visit.  She reports having spells of left facial pain about 1-2 times per week, which can last up to 30-minutes.  She is taking carbamazepine 200mg  three times daily and gabapentin 600mg  - 1200mg  - 1200mg .  No new complaints.   Medications:  Current  Outpatient Medications on File Prior to Visit  Medication Sig Dispense Refill   carbamazepine (TEGRETOL) 200 MG tablet Take half tablet twice daily x 2 weeks, then increase to 1 tablet twice daily.  Stop if any rash develops. (Patient taking differently: 1 tablet three times daily.  Stop if any rash develops.) 60 tablet 3   cholecalciferol (VITAMIN D) 25 MCG (1000 UNIT) tablet Take 1,000 Units by mouth daily.     gabapentin (NEURONTIN) 300 MG capsule Take 3 tablets in the morning, 3 tablets in the afternoon, and 4 tablets at bedtime. (Patient taking differently: Take 2 tablets in the morning, 4 tablets in the afternoon, and 4 tablets at bedtime.) 300 capsule 3   hydrALAZINE (APRESOLINE) 25 MG tablet Take 25 mg by mouth 3 (three) times daily.     HYDROcodone-acetaminophen (NORCO/VICODIN) 5-325 MG tablet Take 1 tablet by mouth in the morning, at noon, and at bedtime.     olmesartan (BENICAR) 40 MG tablet Take 40 mg by mouth daily.     Pantoprazole Sodium (PROTONIX PO) Take by mouth.     rosuvastatin (CRESTOR) 5 MG tablet Take 5 mg by mouth daily.     zolpidem (AMBIEN) 10 MG tablet Take 10 mg by mouth at bedtime.     No current facility-administered medications on file prior to visit.    Allergies:  Allergies  Allergen Reactions   Amitriptyline     Wouldn't let PT go to sleep    Amlodipine     Leg swelling   Citalopram  Pt does not recall reaction    Hydrochlorothiazide W-Triamterene     Per kidney Dr, was taken off med    Metoprolol     Pt does not recall reaction    Penicillin G     Per Patient " I have never actually taken penicillin but I broke out in a rash on the tops of my legs with cephalexin and was told not to take penicillin."   Prednisone     Low doses are ok  - intolerance to higher dosage, affects mental state   Cephalexin Rash    Vital Signs:  BP (!) 177/81   Pulse 73   Ht 5' (1.524 m)   Wt 208 lb (94.3 kg)   SpO2 96%   BMI 40.62 kg/m    Neurological  Exam: MENTAL STATUS including orientation to time, place, person, recent and remote memory, attention span and concentration, language, and fund of knowledge is normal.  Speech is not dysarthric.  CRANIAL NERVES:  Pupils equal round and reactive to light.  Normal conjugate, extra-ocular eye movements in all directions of gaze. Mild left ptosis (old). Face is symmetric. Facial sensation intact. Palate elevates symmetrically.  Tongue is midline.  MOTOR:  Motor strength is 5/5 in all extremities.  No pronator drift.  Tone is normal.    COORDINATION/GAIT:    Gait narrow based and stable.   DATA: n/a   Thank you for allowing me to participate in patient's care.  If I can answer any additional questions, I would be pleased to do so.    Sincerely,    Prince Olivier K. Allena Katz, DO

## 2023-05-10 DIAGNOSIS — G894 Chronic pain syndrome: Secondary | ICD-10-CM | POA: Diagnosis not present

## 2023-05-10 DIAGNOSIS — Z79891 Long term (current) use of opiate analgesic: Secondary | ICD-10-CM | POA: Diagnosis not present

## 2023-05-10 DIAGNOSIS — Z1389 Encounter for screening for other disorder: Secondary | ICD-10-CM | POA: Diagnosis not present

## 2023-05-10 DIAGNOSIS — M1990 Unspecified osteoarthritis, unspecified site: Secondary | ICD-10-CM | POA: Diagnosis not present

## 2023-05-10 DIAGNOSIS — M722 Plantar fascial fibromatosis: Secondary | ICD-10-CM | POA: Diagnosis not present

## 2023-05-10 DIAGNOSIS — G500 Trigeminal neuralgia: Secondary | ICD-10-CM | POA: Diagnosis not present

## 2023-05-16 DIAGNOSIS — N1831 Chronic kidney disease, stage 3a: Secondary | ICD-10-CM | POA: Diagnosis not present

## 2023-05-16 DIAGNOSIS — I129 Hypertensive chronic kidney disease with stage 1 through stage 4 chronic kidney disease, or unspecified chronic kidney disease: Secondary | ICD-10-CM | POA: Diagnosis not present

## 2023-05-16 DIAGNOSIS — R600 Localized edema: Secondary | ICD-10-CM | POA: Diagnosis not present

## 2023-05-21 ENCOUNTER — Telehealth: Payer: Self-pay | Admitting: Neurology

## 2023-05-21 NOTE — Telephone Encounter (Signed)
Called patients pharmacy and asked about the issue with patients Tegretol and why she cannot get it filled? The pharmacist was able to push through the refill with no issues and patient may pick up once ready.   Called patient and informed her that we were able to get her medication filled and she may go and pick up once filled. Patient thanked [place] for our help and had no further questions or concerns.

## 2023-05-21 NOTE — Telephone Encounter (Signed)
Pt stated pharmacy will not release her medication of carbamazepine (TEGRETOL) 200 MG tablet [[id number]] until 06-03-24. Pt stated she only has 1 pill left

## 2023-05-24 DIAGNOSIS — D631 Anemia in chronic kidney disease: Secondary | ICD-10-CM | POA: Diagnosis not present

## 2023-05-24 DIAGNOSIS — N2581 Secondary hyperparathyroidism of renal origin: Secondary | ICD-10-CM | POA: Diagnosis not present

## 2023-05-24 DIAGNOSIS — N1832 Chronic kidney disease, stage 3b: Secondary | ICD-10-CM | POA: Diagnosis not present

## 2023-05-24 DIAGNOSIS — I129 Hypertensive chronic kidney disease with stage 1 through stage 4 chronic kidney disease, or unspecified chronic kidney disease: Secondary | ICD-10-CM | POA: Diagnosis not present

## 2023-05-30 ENCOUNTER — Other Ambulatory Visit (HOSPITAL_COMMUNITY): Payer: Self-pay

## 2023-06-05 ENCOUNTER — Other Ambulatory Visit (HOSPITAL_COMMUNITY): Payer: Self-pay

## 2023-06-05 ENCOUNTER — Telehealth: Payer: Self-pay | Admitting: Pharmacy Technician

## 2023-06-05 NOTE — Telephone Encounter (Signed)
error 

## 2023-06-05 NOTE — Telephone Encounter (Addendum)
Pharmacy Patient Advocate Encounter   Received notification from Physician's Office/Staff msgs that a tier exception for Gabapentin 300mg  Capsules is required/requested.   Insurance verification completed.   The patient is insured through [employer].   Per test claim: Current tier exception is good through 07/03/23. PA started in [facility] on 11/27, but it returned that the pt already had access to the med. Calling the ins to see if they can do the tier exception, as I did not see that as an option on the forms given with the pt's ins info entered in [facility]. Called multiple times. They are having issues with their system. Will try again in another hour or so.

## 2023-06-05 NOTE — Telephone Encounter (Signed)
-----   Message from [facility] Mahina A sent at 05/10/2023 10:07 AM EST ----- Hello,   Every year patient gets a tier exception for her Gabapentin. Can we check and see if it is time to renew this?   Thank you, Mahina

## 2023-06-06 ENCOUNTER — Other Ambulatory Visit (HOSPITAL_COMMUNITY): Payer: Self-pay

## 2023-06-09 DIAGNOSIS — Z90710 Acquired absence of both cervix and uterus: Secondary | ICD-10-CM | POA: Diagnosis not present

## 2023-06-09 DIAGNOSIS — S52612D Displaced fracture of left ulna styloid process, subsequent encounter for closed fracture with routine healing: Secondary | ICD-10-CM | POA: Diagnosis not present

## 2023-06-09 DIAGNOSIS — S52502A Unspecified fracture of the lower end of left radius, initial encounter for closed fracture: Secondary | ICD-10-CM | POA: Diagnosis not present

## 2023-06-09 DIAGNOSIS — S6292XA Unspecified fracture of left wrist and hand, initial encounter for closed fracture: Secondary | ICD-10-CM | POA: Diagnosis not present

## 2023-06-09 DIAGNOSIS — S52502D Unspecified fracture of the lower end of left radius, subsequent encounter for closed fracture with routine healing: Secondary | ICD-10-CM | POA: Diagnosis not present

## 2023-06-09 DIAGNOSIS — Z9049 Acquired absence of other specified parts of digestive tract: Secondary | ICD-10-CM | POA: Diagnosis not present

## 2023-06-09 DIAGNOSIS — W010XXA Fall on same level from slipping, tripping and stumbling without subsequent striking against object, initial encounter: Secondary | ICD-10-CM | POA: Diagnosis not present

## 2023-06-09 DIAGNOSIS — I10 Essential (primary) hypertension: Secondary | ICD-10-CM | POA: Diagnosis not present

## 2023-06-09 DIAGNOSIS — G500 Trigeminal neuralgia: Secondary | ICD-10-CM | POA: Diagnosis not present

## 2023-06-09 DIAGNOSIS — S52572A Other intraarticular fracture of lower end of left radius, initial encounter for closed fracture: Secondary | ICD-10-CM | POA: Diagnosis not present

## 2023-06-09 DIAGNOSIS — S52352A Displaced comminuted fracture of shaft of radius, left arm, initial encounter for closed fracture: Secondary | ICD-10-CM | POA: Diagnosis not present

## 2023-06-09 DIAGNOSIS — S52612A Displaced fracture of left ulna styloid process, initial encounter for closed fracture: Secondary | ICD-10-CM | POA: Diagnosis not present

## 2023-06-11 ENCOUNTER — Other Ambulatory Visit (HOSPITAL_COMMUNITY): Payer: Self-pay

## 2023-06-11 DIAGNOSIS — S52532A Colles' fracture of left radius, initial encounter for closed fracture: Secondary | ICD-10-CM | POA: Diagnosis not present

## 2023-06-11 NOTE — Telephone Encounter (Signed)
Called [employer] to request Tier exception. Submitted by rep Melody M. today. No pa or request #.

## 2023-06-12 NOTE — Telephone Encounter (Signed)
Received notification from patients insurance that Tier exemption has been approved. Valid from 07/04/2023-07/02/2024.

## 2023-06-14 DIAGNOSIS — G894 Chronic pain syndrome: Secondary | ICD-10-CM | POA: Diagnosis not present

## 2023-06-14 DIAGNOSIS — M1990 Unspecified osteoarthritis, unspecified site: Secondary | ICD-10-CM | POA: Diagnosis not present

## 2023-06-14 DIAGNOSIS — M722 Plantar fascial fibromatosis: Secondary | ICD-10-CM | POA: Diagnosis not present

## 2023-06-14 DIAGNOSIS — Z1389 Encounter for screening for other disorder: Secondary | ICD-10-CM | POA: Diagnosis not present

## 2023-06-14 DIAGNOSIS — G500 Trigeminal neuralgia: Secondary | ICD-10-CM | POA: Diagnosis not present

## 2023-06-14 DIAGNOSIS — Z79891 Long term (current) use of opiate analgesic: Secondary | ICD-10-CM | POA: Diagnosis not present

## 2023-06-25 DIAGNOSIS — S52502A Unspecified fracture of the lower end of left radius, initial encounter for closed fracture: Secondary | ICD-10-CM | POA: Diagnosis not present

## 2023-07-08 DIAGNOSIS — R051 Acute cough: Secondary | ICD-10-CM | POA: Diagnosis not present

## 2023-07-08 DIAGNOSIS — R0981 Nasal congestion: Secondary | ICD-10-CM | POA: Diagnosis not present

## 2023-07-08 DIAGNOSIS — R519 Headache, unspecified: Secondary | ICD-10-CM | POA: Diagnosis not present

## 2023-07-08 DIAGNOSIS — J029 Acute pharyngitis, unspecified: Secondary | ICD-10-CM | POA: Diagnosis not present

## 2023-07-22 ENCOUNTER — Other Ambulatory Visit: Payer: Self-pay | Admitting: Neurology

## 2023-07-26 DIAGNOSIS — S52502A Unspecified fracture of the lower end of left radius, initial encounter for closed fracture: Secondary | ICD-10-CM | POA: Diagnosis not present

## 2023-08-20 DIAGNOSIS — S52502A Unspecified fracture of the lower end of left radius, initial encounter for closed fracture: Secondary | ICD-10-CM | POA: Diagnosis not present

## 2023-08-21 DIAGNOSIS — G500 Trigeminal neuralgia: Secondary | ICD-10-CM | POA: Diagnosis not present

## 2023-08-21 DIAGNOSIS — G894 Chronic pain syndrome: Secondary | ICD-10-CM | POA: Diagnosis not present

## 2023-08-21 DIAGNOSIS — M722 Plantar fascial fibromatosis: Secondary | ICD-10-CM | POA: Diagnosis not present

## 2023-08-21 DIAGNOSIS — Z79891 Long term (current) use of opiate analgesic: Secondary | ICD-10-CM | POA: Diagnosis not present

## 2023-08-21 DIAGNOSIS — Z1389 Encounter for screening for other disorder: Secondary | ICD-10-CM | POA: Diagnosis not present

## 2023-08-21 DIAGNOSIS — M1990 Unspecified osteoarthritis, unspecified site: Secondary | ICD-10-CM | POA: Diagnosis not present

## 2023-08-28 ENCOUNTER — Ambulatory Visit: Payer: Medicare HMO | Admitting: Neurology

## 2023-08-28 VITALS — BP 155/89 | HR 88 | Ht 60.0 in | Wt 209.0 lb

## 2023-08-28 DIAGNOSIS — R2689 Other abnormalities of gait and mobility: Secondary | ICD-10-CM

## 2023-08-28 DIAGNOSIS — G500 Trigeminal neuralgia: Secondary | ICD-10-CM | POA: Diagnosis not present

## 2023-08-28 DIAGNOSIS — Z79899 Other long term (current) drug therapy: Secondary | ICD-10-CM | POA: Diagnosis not present

## 2023-08-28 NOTE — Progress Notes (Signed)
 Follow-up Visit   Date: 08/28/2023     Abigail Glover MRN: [id number] DOB: 09-05-1945   Interim History: Abigail Glover is a 78 y.o. left-handed Caucasian female with hypertension returning to the clinic for follow-up of left trigeminal neuralgia.  The patient was accompanied to the clinic by self.  IMPRESSION/PLAN: Left trigeminal neuralgia, improved with pain now occurring once every 3 weeks.    - Continue carbamazepine 200mg  three times daily.  OK to take extra dose as needed for severe pain  - She may reduce gabapentin by 300mg  every 2 weeks and try to adjust gabapentin to 600mg  in the morning, 900mg  in the afternoon, and 900mg  at bedtime  2.  Imbalance (subjective).  No signs of neuropathy or weakness on exam.  She has brisk reflexes at the patella which can be seen with canal stenosis, but in the absence of pain, weakness, or paresthesias, I recommend monitoring.   Return to clinic in 4 months  ------------------------------------------- UPDATE 11/21/2022:  She had her teeth cleaned in April and immediately developed worsening left trigeminal neuralgia.  Pain has been severe so she contacted my office and was instructed to increase gabapentin to 900mg  at bedtime and continue 600mg  in the morning.  She is able to brush but tries to avoid chewing or drinking water on the left side.   She remains off carbamazepine.   She had nonmalignant ovarian masses which was resected in December 2022.    UPDATE 02/06/2023:  She is here for follow-up.  She is taking gabapentin [phone] and reports still having breakthrough left facial pain.  Pain tends to occur around 9pm - 11pm after she returns home from work.  She rarely has facial pain during the daytime.   UPDATE 05/09/2023:  She is here for follow-up visit.  She reports having spells of left facial pain about 1-2 times per week, which can last up to 30-minutes.  She is taking carbamazepine 200mg  three times daily and gabapentin  600mg  - 1200mg  - 1200mg .  No new complaints.   UPDATE 08/28/2023:  She is here for follow-up visit.  She reports having a fall in December and fracture her right wrist.  Soon after her last visit, her left facial pain started improving.  She is doing much better on carbamazepine 200mg  three times daily + gabapentin 600mg  in the morning, 1200mg  in the afternoon, and 1200mg  at bedtime and now only has spells of pain once every 3 weeks.   She also reports spells of imbalance, where she feels she may veer to the side. She walks unassisted. No low back pain, numbness, tingling, or weakness of the legs.    Medications:  Current Outpatient Medications on File Prior to Visit  Medication Sig Dispense Refill   carbamazepine (TEGRETOL) 200 MG tablet Sig: Take 1 tablet three times daily.  OK to take 1 tablet extra daily for severe pain 90 tablet 1   cholecalciferol (VITAMIN D) 25 MCG (1000 UNIT) tablet Take 1,000 Units by mouth daily.     gabapentin (NEURONTIN) 300 MG capsule Take 2 tablets in the morning, 4 tablets in the afternoon, and 4 tablets at bedtime. 900 capsule 3   hydrALAZINE (APRESOLINE) 25 MG tablet Take 25 mg by mouth 3 (three) times daily.     HYDROcodone-acetaminophen (NORCO/VICODIN) 5-325 MG tablet Take 1 tablet by mouth in the morning, at noon, and at bedtime.     olmesartan (BENICAR) 40 MG tablet Take 40 mg by mouth daily.  Pantoprazole Sodium (PROTONIX PO) Take by mouth.     rosuvastatin (CRESTOR) 5 MG tablet Take 5 mg by mouth daily.     zolpidem (AMBIEN) 10 MG tablet Take 10 mg by mouth at bedtime.     No current facility-administered medications on file prior to visit.    Allergies:  Allergies  Allergen Reactions   Amitriptyline     Wouldn't let PT go to sleep    Amlodipine     Leg swelling   Citalopram     Pt does not recall reaction    Hydrochlorothiazide W-Triamterene     Per kidney Dr, was taken off med    Metoprolol     Pt does not recall reaction    Penicillin G      Per Patient " I have never actually taken penicillin but I broke out in a rash on the tops of my legs with cephalexin and was told not to take penicillin."   Prednisone     Low doses are ok  - intolerance to higher dosage, affects mental state   Cephalexin Rash    Vital Signs:  BP (!) 155/89   Pulse 88   Ht 5' (1.524 m)   Wt 209 lb (94.8 kg)   SpO2 93%   BMI 40.82 kg/m    Neurological Exam: MENTAL STATUS including orientation to time, place, person, recent and remote memory, attention span and concentration, language, and fund of knowledge is normal.  Speech is not dysarthric.  CRANIAL NERVES:  Pupils equal round and reactive to light.  Normal conjugate, extra-ocular eye movements in all directions of gaze. Mild left ptosis (old). Face is symmetric. Facial sensation intact. Palate elevates symmetrically.  Tongue is midline.  MOTOR:  Motor strength is 5/5 in all extremities.  No pronator drift.  Tone is normal.    REFLEXES:  Reflexes 2+/4 throughout, 3+/4 at the patella.  SENSORY:  Vibration intact throughout.    COORDINATION/GAIT:    Gait narrow based and stable.   DATA: n/a   Thank you for allowing me to participate in patient's care.  If I can answer any additional questions, I would be pleased to do so.    Sincerely,    Shameria Trimarco K. Allena Katz, DO

## 2023-08-28 NOTE — Patient Instructions (Signed)
 You can lower your gabapentin by taking 600mg  in the morning, 900mg  in the afternoon, and 1200mg  at bedtime for two weeks, then reduce you night time dose to 900mg   Continue taking carbamazepine 200mg  three times daily

## 2023-09-04 ENCOUNTER — Telehealth: Payer: Self-pay | Admitting: Neurology

## 2023-09-04 NOTE — Telephone Encounter (Signed)
 Pt. Cld and thinks lowering the dose of Rx gabapentin, phygital myalgia flared up need advice, call asap

## 2023-09-05 NOTE — Telephone Encounter (Signed)
 Called patient and left a message for a call back.     Need to see if patient would like to go back to her previous dosage of Gabapentin:  2 tablets in the morning  4 tablets in the afternoon 4 tablets at bedtime

## 2023-09-06 NOTE — Telephone Encounter (Signed)
 Called patient and asked if she would like to go on her previous dosage of Gabapentin:  2 tablets in the morning  4 tablets in the afternoon 4 tablets at bedtime  Patient stated, yes she would like to go back to her previous dosage as described above. Patient does has enough medication and will contact us if she needs [place]. Patient had no further questions or concerns.

## 2023-09-06 NOTE — Telephone Encounter (Signed)
Pt called back in returning Mahina's call 

## 2023-09-19 DIAGNOSIS — M1990 Unspecified osteoarthritis, unspecified site: Secondary | ICD-10-CM | POA: Diagnosis not present

## 2023-09-19 DIAGNOSIS — Z79891 Long term (current) use of opiate analgesic: Secondary | ICD-10-CM | POA: Diagnosis not present

## 2023-09-19 DIAGNOSIS — Z1389 Encounter for screening for other disorder: Secondary | ICD-10-CM | POA: Diagnosis not present

## 2023-09-19 DIAGNOSIS — G894 Chronic pain syndrome: Secondary | ICD-10-CM | POA: Diagnosis not present

## 2023-09-19 DIAGNOSIS — G500 Trigeminal neuralgia: Secondary | ICD-10-CM | POA: Diagnosis not present

## 2023-09-19 DIAGNOSIS — M722 Plantar fascial fibromatosis: Secondary | ICD-10-CM | POA: Diagnosis not present

## 2023-10-05 ENCOUNTER — Other Ambulatory Visit: Payer: Self-pay | Admitting: Neurology

## 2023-10-08 DIAGNOSIS — C44311 Basal cell carcinoma of skin of nose: Secondary | ICD-10-CM | POA: Diagnosis not present

## 2023-10-08 DIAGNOSIS — L578 Other skin changes due to chronic exposure to nonionizing radiation: Secondary | ICD-10-CM | POA: Diagnosis not present

## 2023-10-08 DIAGNOSIS — L814 Other melanin hyperpigmentation: Secondary | ICD-10-CM | POA: Diagnosis not present

## 2023-10-08 DIAGNOSIS — L820 Inflamed seborrheic keratosis: Secondary | ICD-10-CM | POA: Diagnosis not present

## 2023-10-08 DIAGNOSIS — L821 Other seborrheic keratosis: Secondary | ICD-10-CM | POA: Diagnosis not present

## 2023-10-16 DIAGNOSIS — E782 Mixed hyperlipidemia: Secondary | ICD-10-CM | POA: Diagnosis not present

## 2023-10-16 DIAGNOSIS — B372 Candidiasis of skin and nail: Secondary | ICD-10-CM | POA: Diagnosis not present

## 2023-10-16 DIAGNOSIS — G894 Chronic pain syndrome: Secondary | ICD-10-CM | POA: Diagnosis not present

## 2023-10-16 DIAGNOSIS — Z1389 Encounter for screening for other disorder: Secondary | ICD-10-CM | POA: Diagnosis not present

## 2023-10-16 DIAGNOSIS — M79671 Pain in right foot: Secondary | ICD-10-CM | POA: Diagnosis not present

## 2023-10-16 DIAGNOSIS — D638 Anemia in other chronic diseases classified elsewhere: Secondary | ICD-10-CM | POA: Diagnosis not present

## 2023-10-16 DIAGNOSIS — N2581 Secondary hyperparathyroidism of renal origin: Secondary | ICD-10-CM | POA: Diagnosis not present

## 2023-10-16 DIAGNOSIS — Z79899 Other long term (current) drug therapy: Secondary | ICD-10-CM | POA: Diagnosis not present

## 2023-10-16 DIAGNOSIS — G500 Trigeminal neuralgia: Secondary | ICD-10-CM | POA: Diagnosis not present

## 2023-10-16 DIAGNOSIS — M722 Plantar fascial fibromatosis: Secondary | ICD-10-CM | POA: Diagnosis not present

## 2023-10-16 DIAGNOSIS — F5101 Primary insomnia: Secondary | ICD-10-CM | POA: Diagnosis not present

## 2023-10-16 DIAGNOSIS — R7303 Prediabetes: Secondary | ICD-10-CM | POA: Diagnosis not present

## 2023-10-16 DIAGNOSIS — R5383 Other fatigue: Secondary | ICD-10-CM | POA: Diagnosis not present

## 2023-10-16 DIAGNOSIS — I10 Essential (primary) hypertension: Secondary | ICD-10-CM | POA: Diagnosis not present

## 2023-10-16 DIAGNOSIS — N1831 Chronic kidney disease, stage 3a: Secondary | ICD-10-CM | POA: Diagnosis not present

## 2023-10-16 DIAGNOSIS — Z79891 Long term (current) use of opiate analgesic: Secondary | ICD-10-CM | POA: Diagnosis not present

## 2023-10-18 DIAGNOSIS — M25562 Pain in left knee: Secondary | ICD-10-CM | POA: Diagnosis not present

## 2023-10-18 DIAGNOSIS — M13862 Other specified arthritis, left knee: Secondary | ICD-10-CM | POA: Diagnosis not present

## 2023-10-31 DIAGNOSIS — M7731 Calcaneal spur, right foot: Secondary | ICD-10-CM | POA: Diagnosis not present

## 2023-10-31 DIAGNOSIS — M79671 Pain in right foot: Secondary | ICD-10-CM | POA: Diagnosis not present

## 2023-10-31 DIAGNOSIS — M19071 Primary osteoarthritis, right ankle and foot: Secondary | ICD-10-CM | POA: Diagnosis not present

## 2023-11-12 DIAGNOSIS — D2239 Melanocytic nevi of other parts of face: Secondary | ICD-10-CM | POA: Diagnosis not present

## 2023-11-12 DIAGNOSIS — L820 Inflamed seborrheic keratosis: Secondary | ICD-10-CM | POA: Diagnosis not present

## 2023-11-12 DIAGNOSIS — I781 Nevus, non-neoplastic: Secondary | ICD-10-CM | POA: Diagnosis not present

## 2023-11-14 DIAGNOSIS — I129 Hypertensive chronic kidney disease with stage 1 through stage 4 chronic kidney disease, or unspecified chronic kidney disease: Secondary | ICD-10-CM | POA: Diagnosis not present

## 2023-11-14 DIAGNOSIS — D631 Anemia in chronic kidney disease: Secondary | ICD-10-CM | POA: Diagnosis not present

## 2023-11-14 DIAGNOSIS — N2581 Secondary hyperparathyroidism of renal origin: Secondary | ICD-10-CM | POA: Diagnosis not present

## 2023-11-14 DIAGNOSIS — N1832 Chronic kidney disease, stage 3b: Secondary | ICD-10-CM | POA: Diagnosis not present

## 2023-11-22 DIAGNOSIS — N2581 Secondary hyperparathyroidism of renal origin: Secondary | ICD-10-CM | POA: Diagnosis not present

## 2023-11-22 DIAGNOSIS — D631 Anemia in chronic kidney disease: Secondary | ICD-10-CM | POA: Diagnosis not present

## 2023-11-22 DIAGNOSIS — I129 Hypertensive chronic kidney disease with stage 1 through stage 4 chronic kidney disease, or unspecified chronic kidney disease: Secondary | ICD-10-CM | POA: Diagnosis not present

## 2023-11-22 DIAGNOSIS — N1831 Chronic kidney disease, stage 3a: Secondary | ICD-10-CM | POA: Diagnosis not present

## 2023-11-23 DIAGNOSIS — I10 Essential (primary) hypertension: Secondary | ICD-10-CM | POA: Diagnosis not present

## 2023-11-23 DIAGNOSIS — K219 Gastro-esophageal reflux disease without esophagitis: Secondary | ICD-10-CM | POA: Diagnosis not present

## 2023-11-23 DIAGNOSIS — G894 Chronic pain syndrome: Secondary | ICD-10-CM | POA: Diagnosis not present

## 2023-11-23 DIAGNOSIS — G500 Trigeminal neuralgia: Secondary | ICD-10-CM | POA: Diagnosis not present

## 2023-12-10 DIAGNOSIS — M5431 Sciatica, right side: Secondary | ICD-10-CM | POA: Diagnosis not present

## 2023-12-10 DIAGNOSIS — M25551 Pain in right hip: Secondary | ICD-10-CM | POA: Diagnosis not present

## 2023-12-10 DIAGNOSIS — M1611 Unilateral primary osteoarthritis, right hip: Secondary | ICD-10-CM | POA: Diagnosis not present

## 2023-12-18 ENCOUNTER — Encounter: Payer: Self-pay | Admitting: Neurology

## 2023-12-18 ENCOUNTER — Ambulatory Visit: Payer: Medicare HMO | Admitting: Neurology

## 2023-12-18 VITALS — BP 101/66 | HR 93 | Ht 59.0 in | Wt 211.0 lb

## 2023-12-18 DIAGNOSIS — G500 Trigeminal neuralgia: Secondary | ICD-10-CM

## 2023-12-18 DIAGNOSIS — R2689 Other abnormalities of gait and mobility: Secondary | ICD-10-CM

## 2023-12-18 DIAGNOSIS — Z79899 Other long term (current) drug therapy: Secondary | ICD-10-CM

## 2023-12-18 MED ORDER — GABAPENTIN 300 MG PO CAPS: ORAL_CAPSULE | ORAL | 3 refills | Status: DC

## 2023-12-18 MED ORDER — CARBAMAZEPINE 200 MG PO TABS: ORAL_TABLET | ORAL | 3 refills | Status: AC

## 2023-12-18 NOTE — Progress Notes (Signed)
 Follow-up Visit   Date: 12/18/2023     Abigail Glover MRN: [id number] DOB: 03-Dec-1945   Interim History: Abigail Glover is a 78 y.o. left-handed Caucasian female with hypertension returning to the clinic for follow-up of left trigeminal neuralgia.  The patient was accompanied to the clinic by self.  IMPRESSION/PLAN: Left trigeminal neuralgia, stable however she has CKD now so will need to lower dose of gabapentin , which may worsen her pain  - Increase carbamazepine  to 400mg  in the morning, 200mg  in the afternoon, and 200mg  at bedtime  - Due to CKD, slowly taper gabapentin  by 300mg  each week to goal of 600mg  in the morning, 900mg  in the afternoon, and 900mg  at bedtime.  May need taper her dose going forward.  2.  Imbalance (subjective).  She may be having side effects from gabapentin .  No signs of neuropathy or weakness on exam.  She has brisk reflexes at the patella which can be seen with canal stenosis, but in the absence of pain, weakness, or paresthesias, I recommend monitoring.   Return to clinic in 4 months  ------------------------------------------- UPDATE 11/21/2022:  She had her teeth cleaned in April and immediately developed worsening left trigeminal neuralgia.  Pain has been severe so she contacted my office and was instructed to increase gabapentin  to 900mg  at bedtime and continue 600mg  in the morning.  She is able to brush but tries to avoid chewing or drinking water on the left side.   She remains off carbamazepine .   She had nonmalignant ovarian masses which was resected in December 2022.    UPDATE 02/06/2023:  She is here for follow-up.  She is taking gabapentin  [phone] and reports still having breakthrough left facial pain.  Pain tends to occur around 9pm - 11pm after she returns home from work.  She rarely has facial pain during the daytime.   UPDATE 05/09/2023:  She is here for follow-up visit.  She reports having spells of left facial pain about 1-2  times per week, which can last up to 30-minutes.  She is taking carbamazepine  200mg  three times daily and gabapentin  600mg  - 1200mg  - 1200mg .  No new complaints.   UPDATE 08/28/2023:  She is here for follow-up visit.  She reports having a fall in December and fracture her right wrist.  Soon after her last visit, her left facial pain started improving.  She is doing much better on carbamazepine  200mg  three times daily + gabapentin  600mg  in the morning, 1200mg  in the afternoon, and 1200mg  at bedtime and now only has spells of pain once every 3 weeks.   She also reports spells of imbalance, where she feels she may veer to the side. She walks unassisted. No low back pain, numbness, tingling, or weakness of the legs.    UPDATE 12/18/2023:  She is here for follow-up visit. She is taking carbamazepine  200mg  three times daily which mostly controls her facial pain.  She takes an extra dose about once every 2 weeks.  She also takes gabapentin  600mg  in the morning, 1200mg  in the afternoon, and 1200mg  at bedtime. She was unable to reduce the dose of this because developed worsening pain.   She continues to have imbalance. She has developed CKD with last GFR 50.  Medications:  Current Outpatient Medications on File Prior to Visit  Medication Sig Dispense Refill   carbamazepine  (TEGRETOL ) 200 MG tablet TAKE 1 TABLET THREE TIMES DAILY. OK TO TAKE 1 TABLET EXTRA DAILY FOR SEVERE PAIN. 360 tablet 0  cholecalciferol (VITAMIN D) 25 MCG (1000 UNIT) tablet Take 1,000 Units by mouth daily.     gabapentin  (NEURONTIN ) 300 MG capsule Take 2 tablets in the morning, 4 tablets in the afternoon, and 4 tablets at bedtime. 900 capsule 3   hydrALAZINE (APRESOLINE) 25 MG tablet Take 25 mg by mouth 3 (three) times daily.     HYDROcodone-acetaminophen  (NORCO/VICODIN) 5-325 MG tablet Take 1 tablet by mouth in the morning, at noon, and at bedtime.     olmesartan (BENICAR) 40 MG tablet Take 40 mg by mouth daily.     Pantoprazole  Sodium  (PROTONIX  PO) Take by mouth.     rosuvastatin (CRESTOR) 5 MG tablet Take 5 mg by mouth daily.     zolpidem (AMBIEN) 10 MG tablet Take 10 mg by mouth at bedtime.     No current facility-administered medications on file prior to visit.    Allergies:  Allergies  Allergen Reactions   Amitriptyline     Wouldn't let PT go to sleep    Amlodipine     Leg swelling   Citalopram     Pt does not recall reaction    Hydrochlorothiazide-Triamterene     Per kidney Dr, was taken off med    Metoprolol     Pt does not recall reaction    Penicillin G     Per Patient  I have never actually taken penicillin but I broke out in a rash on the tops of my legs with cephalexin and was told not to take penicillin.   Prednisone     Low doses are ok  - intolerance to higher dosage, affects mental state   Cephalexin Rash    Vital Signs:  BP 101/66   Pulse 93   Ht 4' 11 (1.499 m)   Wt 211 lb (95.7 kg)   SpO2 95%   BMI 42.62 kg/m    Neurological Exam: MENTAL STATUS including orientation to time, place, person, recent and remote memory, attention span and concentration, language, and fund of knowledge is normal.  Speech is not dysarthric.  CRANIAL NERVES:   Normal conjugate, extra-ocular eye movements in all directions of gaze. Mild left ptosis (old). Face is symmetric. Facial sensation intact. Palate elevates symmetrically.  Tongue is midline.  MOTOR:  Motor strength is 5/5 in all extremities.  No pronator drift.  Tone is normal.    REFLEXES:  Reflexes 2+/4 throughout, 3+/4 at the patella.  SENSORY:  Vibration intact throughout.    COORDINATION/GAIT:    Gait mildly wide-based and stable. Unassisted.    DATA: n/a   Thank you for allowing me to participate in patient's care.  If I can answer any additional questions, I would be pleased to do so.    Sincerely,    Angela Platner K. Lydia Sams, DO

## 2023-12-18 NOTE — Patient Instructions (Addendum)
 Increase carbamazepine  to 400mg  in the morning, 200mg  in the afternoon, and 200mg  at bedtime  Reduce gabapentin  by 300mg  each week.  Week 1:  take 2 tablets in the morning, 4 tablets in the afternoon, and 3 tablets at bedtime Week 2:  2 tablets in the morning, 3 tablets in the afternoon, and 3 tablets at bedtime  Call with update in 4-6 weeks, since we may need to reduce the dose further.

## 2023-12-24 ENCOUNTER — Telehealth: Payer: Self-pay | Admitting: Neurology

## 2023-12-24 NOTE — Telephone Encounter (Signed)
 Please recommend that she go back to taking carbamazepine  200mg  three times daily. Thanks.

## 2023-12-24 NOTE — Telephone Encounter (Signed)
 Pt needs to speak to someone about the increase dosage carbamazepine . The increase is making her dizzy

## 2023-12-26 DIAGNOSIS — I10 Essential (primary) hypertension: Secondary | ICD-10-CM | POA: Diagnosis not present

## 2023-12-26 DIAGNOSIS — K219 Gastro-esophageal reflux disease without esophagitis: Secondary | ICD-10-CM | POA: Diagnosis not present

## 2023-12-26 DIAGNOSIS — M19079 Primary osteoarthritis, unspecified ankle and foot: Secondary | ICD-10-CM | POA: Diagnosis not present

## 2023-12-26 NOTE — Telephone Encounter (Signed)
 Called patient and left a message for a call back.

## 2023-12-26 NOTE — Telephone Encounter (Signed)
 Called patient and informed her that per Dr. Tobie she may go back to taking carbamazepine  200mg  three times daily. Patient will do as recommended and also wanted to let Dr. Tobie know that the decrease in her gabapentin  dosage has been going well. Patient will keep us  informed on how she continues to do.   Patient verbalized understanding and had no further questions or concerns.

## 2024-01-17 DIAGNOSIS — M25551 Pain in right hip: Secondary | ICD-10-CM | POA: Diagnosis not present

## 2024-01-17 DIAGNOSIS — R7303 Prediabetes: Secondary | ICD-10-CM | POA: Diagnosis not present

## 2024-01-28 DIAGNOSIS — M62838 Other muscle spasm: Secondary | ICD-10-CM | POA: Diagnosis not present

## 2024-01-28 DIAGNOSIS — M25551 Pain in right hip: Secondary | ICD-10-CM | POA: Diagnosis not present

## 2024-01-29 DIAGNOSIS — G5701 Lesion of sciatic nerve, right lower limb: Secondary | ICD-10-CM | POA: Diagnosis not present

## 2024-01-29 DIAGNOSIS — M19079 Primary osteoarthritis, unspecified ankle and foot: Secondary | ICD-10-CM | POA: Diagnosis not present

## 2024-01-29 DIAGNOSIS — G500 Trigeminal neuralgia: Secondary | ICD-10-CM | POA: Diagnosis not present

## 2024-01-29 DIAGNOSIS — I10 Essential (primary) hypertension: Secondary | ICD-10-CM | POA: Diagnosis not present

## 2024-01-29 DIAGNOSIS — K219 Gastro-esophageal reflux disease without esophagitis: Secondary | ICD-10-CM | POA: Diagnosis not present

## 2024-01-30 DIAGNOSIS — Z1331 Encounter for screening for depression: Secondary | ICD-10-CM | POA: Diagnosis not present

## 2024-01-30 DIAGNOSIS — M62838 Other muscle spasm: Secondary | ICD-10-CM | POA: Diagnosis not present

## 2024-01-30 DIAGNOSIS — N1831 Chronic kidney disease, stage 3a: Secondary | ICD-10-CM | POA: Diagnosis not present

## 2024-02-12 ENCOUNTER — Other Ambulatory Visit: Payer: Self-pay | Admitting: Neurology

## 2024-03-06 DIAGNOSIS — R350 Frequency of micturition: Secondary | ICD-10-CM | POA: Diagnosis not present

## 2024-03-06 DIAGNOSIS — R3915 Urgency of urination: Secondary | ICD-10-CM | POA: Diagnosis not present

## 2024-03-06 DIAGNOSIS — N390 Urinary tract infection, site not specified: Secondary | ICD-10-CM | POA: Diagnosis not present

## 2024-03-06 DIAGNOSIS — R300 Dysuria: Secondary | ICD-10-CM | POA: Diagnosis not present

## 2024-03-07 DIAGNOSIS — Z79891 Long term (current) use of opiate analgesic: Secondary | ICD-10-CM | POA: Diagnosis not present

## 2024-03-07 DIAGNOSIS — K219 Gastro-esophageal reflux disease without esophagitis: Secondary | ICD-10-CM | POA: Diagnosis not present

## 2024-03-07 DIAGNOSIS — M19079 Primary osteoarthritis, unspecified ankle and foot: Secondary | ICD-10-CM | POA: Diagnosis not present

## 2024-03-07 DIAGNOSIS — I10 Essential (primary) hypertension: Secondary | ICD-10-CM | POA: Diagnosis not present

## 2024-03-07 DIAGNOSIS — G5701 Lesion of sciatic nerve, right lower limb: Secondary | ICD-10-CM | POA: Diagnosis not present

## 2024-04-18 DIAGNOSIS — E782 Mixed hyperlipidemia: Secondary | ICD-10-CM | POA: Diagnosis not present

## 2024-04-18 DIAGNOSIS — D638 Anemia in other chronic diseases classified elsewhere: Secondary | ICD-10-CM | POA: Diagnosis not present

## 2024-04-18 DIAGNOSIS — R7303 Prediabetes: Secondary | ICD-10-CM | POA: Diagnosis not present

## 2024-04-18 DIAGNOSIS — N2581 Secondary hyperparathyroidism of renal origin: Secondary | ICD-10-CM | POA: Diagnosis not present

## 2024-04-18 DIAGNOSIS — N1831 Chronic kidney disease, stage 3a: Secondary | ICD-10-CM | POA: Diagnosis not present

## 2024-04-18 DIAGNOSIS — Z139 Encounter for screening, unspecified: Secondary | ICD-10-CM | POA: Diagnosis not present

## 2024-04-18 DIAGNOSIS — F5101 Primary insomnia: Secondary | ICD-10-CM | POA: Diagnosis not present

## 2024-04-18 DIAGNOSIS — I10 Essential (primary) hypertension: Secondary | ICD-10-CM | POA: Diagnosis not present

## 2024-04-25 DIAGNOSIS — H524 Presbyopia: Secondary | ICD-10-CM | POA: Diagnosis not present

## 2024-04-30 DIAGNOSIS — G5701 Lesion of sciatic nerve, right lower limb: Secondary | ICD-10-CM | POA: Diagnosis not present

## 2024-04-30 DIAGNOSIS — K219 Gastro-esophageal reflux disease without esophagitis: Secondary | ICD-10-CM | POA: Diagnosis not present

## 2024-04-30 DIAGNOSIS — M19079 Primary osteoarthritis, unspecified ankle and foot: Secondary | ICD-10-CM | POA: Diagnosis not present

## 2024-04-30 DIAGNOSIS — I10 Essential (primary) hypertension: Secondary | ICD-10-CM | POA: Diagnosis not present

## 2024-05-06 ENCOUNTER — Ambulatory Visit: Admitting: Neurology

## 2024-05-06 VITALS — BP 125/82 | HR 87 | Ht 59.0 in | Wt 188.4 lb

## 2024-05-06 DIAGNOSIS — G500 Trigeminal neuralgia: Secondary | ICD-10-CM | POA: Diagnosis not present

## 2024-05-06 NOTE — Progress Notes (Signed)
 Follow-up Visit   Date: 05/06/2024     Abigail Glover MRN: [id number] DOB: 1946/06/01   Interim History: Abigail Glover is a 78 y.o. left-handed Caucasian female with hypertension returning to the clinic for follow-up of left trigeminal neuralgia.  The patient was accompanied to the clinic by self.  IMPRESSION/PLAN: Left trigeminal neuralgia, stable.   - Start gabapentin  600mg  in the morning, 300mg  in the afternoon, and 300mg  at bedtime.  Ok to take extra dose as needed - Continue carbamazepine  200mg  three times daily  2.  Bilateral feet pain, seems more musculoskeletal.  Distal sensation is intact and symptoms are not characteristic of neuropathy.   Return to clinic in 6 months  ------------------------------------------- UPDATE 11/21/2022:  She had her teeth cleaned in April and immediately developed worsening left trigeminal neuralgia.  Pain has been severe so she contacted my office and was instructed to increase gabapentin  to 900mg  at bedtime and continue 600mg  in the morning.  She is able to brush but tries to avoid chewing or drinking water on the left side.   She remains off carbamazepine .   She had nonmalignant ovarian masses which was resected in December 2022.    UPDATE 02/06/2023:  She is here for follow-up.  She is taking gabapentin  [phone] and reports still having breakthrough left facial pain.  Pain tends to occur around 9pm - 11pm after she returns home from work.  She rarely has facial pain during the daytime.   UPDATE 05/09/2023:  She is here for follow-up visit.  She reports having spells of left facial pain about 1-2 times per week, which can last up to 30-minutes.  She is taking carbamazepine  200mg  three times daily and gabapentin  600mg  - 1200mg  - 1200mg .  No new complaints.   UPDATE 08/28/2023:  She is here for follow-up visit.  She reports having a fall in December and fracture her right wrist.  Soon after her last visit, her left facial pain  started improving.  She is doing much better on carbamazepine  200mg  three times daily + gabapentin  600mg  in the morning, 1200mg  in the afternoon, and 1200mg  at bedtime and now only has spells of pain once every 3 weeks.   She also reports spells of imbalance, where she feels she may veer to the side. She walks unassisted. No low back pain, numbness, tingling, or weakness of the legs.    UPDATE 12/18/2023:  She is here for follow-up visit. She is taking carbamazepine  200mg  three times daily which mostly controls her facial pain.  She takes an extra dose about once every 2 weeks.  She also takes gabapentin  600mg  in the morning, 1200mg  in the afternoon, and 1200mg  at bedtime. She was unable to reduce the dose of this because developed worsening pain.   She continues to have imbalance. She has developed CKD with last GFR 50.  UPDATE 05/06/2024:  She is here for follow-up visit.  She is doing well and reports that pain overall is stable.  She was able to taper down gabapentin .  She takes gabapentin  600mg  in the AM, 300mg  in afternoon, and 300mg  at bedtime and carbamazepine  200mg  three times daily.  She has been on this dose for the past month and no worsening with pain.  Occasionally, she will take extra gabapentin  for severe pain.   She also complains of bilateral achy pain over the dorsum of both feet and has seen podiatry without improvement.   Medications:  Current Outpatient Medications on File Prior to  Visit  Medication Sig Dispense Refill   carbamazepine  (TEGRETOL ) 200 MG tablet Take 2 tablet in the morning and 1 tablet in the afternoon and 1 tablet at bedtime 360 tablet 3   cholecalciferol (VITAMIN D) 25 MCG (1000 UNIT) tablet Take 1,000 Units by mouth daily.     dicyclomine (BENTYL) 10 MG capsule Take 10 mg by mouth 3 (three) times daily as needed.     gabapentin  (NEURONTIN ) 300 MG capsule Take 2 tablets in the morning, 3 tablets in the afternoon, and 3 tablets at bedtime. (Patient taking  differently: Take 300 mg by mouth 3 (three) times daily. Take 2 tablets in the morning, 1 tablets in the afternoon, and 1 tablets at bedtime.) 900 capsule 3   hydrALAZINE (APRESOLINE) 25 MG tablet Take 25 mg by mouth 3 (three) times daily.     HYDROcodone-acetaminophen  (NORCO/VICODIN) 5-325 MG tablet Take 1 tablet by mouth in the morning, at noon, and at bedtime.     olmesartan (BENICAR) 40 MG tablet Take 40 mg by mouth daily.     OZEMPIC, 1 MG/DOSE, 4 MG/3ML SOPN Inject 1 mg into the skin once a week.     Pantoprazole  Sodium (PROTONIX  PO) Take by mouth.     rosuvastatin (CRESTOR) 5 MG tablet Take 5 mg by mouth daily.     zolpidem (AMBIEN) 10 MG tablet Take 10 mg by mouth at bedtime.     No current facility-administered medications on file prior to visit.    Allergies:  Allergies  Allergen Reactions   Amitriptyline     Wouldn't let PT go to sleep    Amlodipine     Leg swelling   Citalopram     Pt does not recall reaction    Hydrochlorothiazide-Triamterene     Per kidney Dr, was taken off med    Metoprolol     Pt does not recall reaction    Penicillin G     Per Patient  I have never actually taken penicillin but I broke out in a rash on the tops of my legs with cephalexin and was told not to take penicillin.   Prednisone     Low doses are ok  - intolerance to higher dosage, affects mental state   Cephalexin Rash    Vital Signs:  BP 125/82   Pulse 87   Ht 4' 11 (1.499 m)   Wt 188 lb 6.4 oz (85.5 kg)   SpO2 93%   BMI 38.05 kg/m    Neurological Exam: MENTAL STATUS including orientation to time, place, person, recent and remote memory, attention span and concentration, language, and fund of knowledge is normal.  Speech is not dysarthric.  CRANIAL NERVES:   Normal conjugate, extra-ocular eye movements in all directions of gaze. Mild left ptosis (old). Face is symmetric. Facial sensation intact.  MOTOR:  Motor strength is 5/5 in all extremities.  No pronator drift.  Tone is  normal.    REFLEXES:  Reflexes are 2+/4 throughout  SENSATION:  Vibration is intact throughout  COORDINATION/GAIT:    Gait mildly wide-based and stable. Unassisted.    DATA: n/a   Thank you for allowing me to participate in patient's care.  If I can answer any additional questions, I would be pleased to do so.    Sincerely,    Herberta Pickron K. Tobie, DO

## 2024-05-06 NOTE — Progress Notes (Signed)
 Staff message sent to PA team to start tier exemption for Gabapentin .

## 2024-05-14 ENCOUNTER — Telehealth: Payer: Self-pay | Admitting: Pharmacy Technician

## 2024-05-14 ENCOUNTER — Other Ambulatory Visit (HOSPITAL_COMMUNITY): Payer: Self-pay

## 2024-05-14 NOTE — Telephone Encounter (Signed)
 A Tier Exception for GABAPENTIN  300MG  has been requested by patient to patient plan [employer]. The plan will fax a determination, typically within 1 to 5 business days.   Submitted via PHONE [phone]) on 11.12.25 Case Number: [id number]

## 2024-05-15 NOTE — Telephone Encounter (Signed)
 Pharmacy Patient Advocate Encounter  Received notification from [employer] that TIER EXCEPTION REQUEST for GABAPENTIN  has been APPROVED from 1.1.26 to 12.31.26

## 2024-05-30 ENCOUNTER — Other Ambulatory Visit: Payer: Self-pay | Admitting: Neurology

## 2024-11-11 ENCOUNTER — Ambulatory Visit: Admitting: Neurology
